# Patient Record
Sex: Female | Born: 1945 | Race: White | Hispanic: No | Smoking: Never smoker
Health system: Southern US, Community
[De-identification: ages and names within clinical notes are randomized; demographics above are authoritative.]

## PROBLEM LIST (undated history)

## (undated) DIAGNOSIS — K589 Irritable bowel syndrome without diarrhea: Secondary | ICD-10-CM

## (undated) DIAGNOSIS — I341 Nonrheumatic mitral (valve) prolapse: Secondary | ICD-10-CM

## (undated) DIAGNOSIS — R32 Unspecified urinary incontinence: Secondary | ICD-10-CM

## (undated) DIAGNOSIS — I10 Essential (primary) hypertension: Secondary | ICD-10-CM

## (undated) DIAGNOSIS — M8580 Other specified disorders of bone density and structure, unspecified site: Secondary | ICD-10-CM

## (undated) DIAGNOSIS — E785 Hyperlipidemia, unspecified: Secondary | ICD-10-CM

## (undated) DIAGNOSIS — I6521 Occlusion and stenosis of right carotid artery: Secondary | ICD-10-CM

## (undated) DIAGNOSIS — F419 Anxiety disorder, unspecified: Secondary | ICD-10-CM

## (undated) HISTORY — DX: Other specified disorders of bone density and structure, unspecified site: M85.80

## (undated) HISTORY — DX: Irritable bowel syndrome, unspecified: K58.9

## (undated) HISTORY — DX: Unspecified urinary incontinence: R32

## (undated) HISTORY — PX: ABDOMINAL HYSTERECTOMY: SHX81

## (undated) HISTORY — DX: Hyperlipidemia, unspecified: E78.5

## (undated) HISTORY — DX: Anxiety disorder, unspecified: F41.9

## (undated) HISTORY — DX: Essential (primary) hypertension: I10

## (undated) HISTORY — PX: OTHER SURGICAL HISTORY: SHX169

## (undated) HISTORY — DX: Nonrheumatic mitral (valve) prolapse: I34.1

## (undated) HISTORY — PX: TUBAL LIGATION: SHX77

## (undated) HISTORY — PX: APPENDECTOMY: SHX54

## (undated) HISTORY — DX: Occlusion and stenosis of right carotid artery: I65.21

---

## 1998-05-08 ENCOUNTER — Ambulatory Visit (HOSPITAL_COMMUNITY): Admission: RE | Admit: 1998-05-08 | Discharge: 1998-05-08 | Payer: Self-pay | Admitting: Gastroenterology

## 2001-12-29 ENCOUNTER — Ambulatory Visit (HOSPITAL_COMMUNITY): Admission: RE | Admit: 2001-12-29 | Discharge: 2001-12-29 | Payer: Self-pay | Admitting: Family Medicine

## 2003-10-21 ENCOUNTER — Ambulatory Visit (HOSPITAL_COMMUNITY): Admission: RE | Admit: 2003-10-21 | Discharge: 2003-10-21 | Payer: Self-pay | Admitting: Gastroenterology

## 2010-07-24 LAB — HM PAP SMEAR: HM Pap smear: NORMAL

## 2010-08-10 ENCOUNTER — Other Ambulatory Visit: Payer: Self-pay | Admitting: Gastroenterology

## 2010-12-10 ENCOUNTER — Encounter: Payer: Self-pay | Admitting: Family Medicine

## 2010-12-10 DIAGNOSIS — I341 Nonrheumatic mitral (valve) prolapse: Secondary | ICD-10-CM | POA: Insufficient documentation

## 2011-02-21 ENCOUNTER — Other Ambulatory Visit: Payer: Self-pay | Admitting: Family Medicine

## 2011-02-21 DIAGNOSIS — Z780 Asymptomatic menopausal state: Secondary | ICD-10-CM

## 2011-02-26 ENCOUNTER — Ambulatory Visit
Admission: RE | Admit: 2011-02-26 | Discharge: 2011-02-26 | Disposition: A | Discharge status: Home / Self Care | Payer: Medicare Other | Source: Ambulatory Visit | Attending: Family Medicine | Admitting: Family Medicine

## 2011-02-26 DIAGNOSIS — Z780 Asymptomatic menopausal state: Secondary | ICD-10-CM

## 2011-03-05 ENCOUNTER — Ambulatory Visit (HOSPITAL_COMMUNITY): Payer: Medicare Other

## 2011-03-14 ENCOUNTER — Ambulatory Visit (HOSPITAL_COMMUNITY)
Admission: RE | Admit: 2011-03-14 | Discharge: 2011-03-14 | Disposition: A | Discharge status: Home / Self Care | Payer: Medicare Other | Source: Ambulatory Visit | Attending: Family Medicine | Admitting: Family Medicine

## 2011-03-14 DIAGNOSIS — I059 Rheumatic mitral valve disease, unspecified: Secondary | ICD-10-CM | POA: Insufficient documentation

## 2011-03-14 DIAGNOSIS — I369 Nonrheumatic tricuspid valve disorder, unspecified: Secondary | ICD-10-CM

## 2011-07-31 DIAGNOSIS — K648 Other hemorrhoids: Secondary | ICD-10-CM | POA: Diagnosis not present

## 2011-07-31 DIAGNOSIS — K625 Hemorrhage of anus and rectum: Secondary | ICD-10-CM | POA: Diagnosis not present

## 2011-07-31 DIAGNOSIS — K589 Irritable bowel syndrome without diarrhea: Secondary | ICD-10-CM | POA: Diagnosis not present

## 2011-07-31 DIAGNOSIS — K5900 Constipation, unspecified: Secondary | ICD-10-CM | POA: Diagnosis not present

## 2011-08-14 DIAGNOSIS — K5900 Constipation, unspecified: Secondary | ICD-10-CM | POA: Diagnosis not present

## 2011-08-14 DIAGNOSIS — K648 Other hemorrhoids: Secondary | ICD-10-CM | POA: Diagnosis not present

## 2011-09-04 DIAGNOSIS — K648 Other hemorrhoids: Secondary | ICD-10-CM | POA: Diagnosis not present

## 2011-09-30 DIAGNOSIS — Z1231 Encounter for screening mammogram for malignant neoplasm of breast: Secondary | ICD-10-CM | POA: Diagnosis not present

## 2011-11-13 DIAGNOSIS — H2510 Age-related nuclear cataract, unspecified eye: Secondary | ICD-10-CM | POA: Diagnosis not present

## 2012-01-07 DIAGNOSIS — D485 Neoplasm of uncertain behavior of skin: Secondary | ICD-10-CM | POA: Diagnosis not present

## 2012-01-07 DIAGNOSIS — L259 Unspecified contact dermatitis, unspecified cause: Secondary | ICD-10-CM | POA: Diagnosis not present

## 2012-01-29 DIAGNOSIS — E559 Vitamin D deficiency, unspecified: Secondary | ICD-10-CM | POA: Diagnosis not present

## 2012-01-29 DIAGNOSIS — Z79899 Other long term (current) drug therapy: Secondary | ICD-10-CM | POA: Diagnosis not present

## 2012-01-29 DIAGNOSIS — I259 Chronic ischemic heart disease, unspecified: Secondary | ICD-10-CM | POA: Diagnosis not present

## 2012-01-29 DIAGNOSIS — Z0000 Encounter for general adult medical examination without abnormal findings: Secondary | ICD-10-CM | POA: Diagnosis not present

## 2012-01-29 DIAGNOSIS — E785 Hyperlipidemia, unspecified: Secondary | ICD-10-CM | POA: Diagnosis not present

## 2012-02-05 DIAGNOSIS — Z0000 Encounter for general adult medical examination without abnormal findings: Secondary | ICD-10-CM | POA: Diagnosis not present

## 2012-03-06 DIAGNOSIS — Z23 Encounter for immunization: Secondary | ICD-10-CM | POA: Diagnosis not present

## 2012-03-26 DIAGNOSIS — K589 Irritable bowel syndrome without diarrhea: Secondary | ICD-10-CM | POA: Diagnosis not present

## 2012-03-26 DIAGNOSIS — K219 Gastro-esophageal reflux disease without esophagitis: Secondary | ICD-10-CM | POA: Diagnosis not present

## 2012-07-29 DIAGNOSIS — R300 Dysuria: Secondary | ICD-10-CM | POA: Diagnosis not present

## 2012-07-29 DIAGNOSIS — Z0000 Encounter for general adult medical examination without abnormal findings: Secondary | ICD-10-CM | POA: Diagnosis not present

## 2012-07-29 DIAGNOSIS — N760 Acute vaginitis: Secondary | ICD-10-CM | POA: Diagnosis not present

## 2012-07-29 DIAGNOSIS — Z124 Encounter for screening for malignant neoplasm of cervix: Secondary | ICD-10-CM | POA: Diagnosis not present

## 2012-07-29 DIAGNOSIS — E785 Hyperlipidemia, unspecified: Secondary | ICD-10-CM | POA: Diagnosis not present

## 2012-07-29 DIAGNOSIS — Z20828 Contact with and (suspected) exposure to other viral communicable diseases: Secondary | ICD-10-CM | POA: Diagnosis not present

## 2012-07-29 DIAGNOSIS — R109 Unspecified abdominal pain: Secondary | ICD-10-CM | POA: Diagnosis not present

## 2012-07-29 DIAGNOSIS — Z01419 Encounter for gynecological examination (general) (routine) without abnormal findings: Secondary | ICD-10-CM | POA: Diagnosis not present

## 2012-10-02 DIAGNOSIS — Z1231 Encounter for screening mammogram for malignant neoplasm of breast: Secondary | ICD-10-CM | POA: Diagnosis not present

## 2012-10-17 ENCOUNTER — Encounter: Payer: Self-pay | Admitting: Family Medicine

## 2012-11-24 ENCOUNTER — Telehealth: Payer: Self-pay | Admitting: Family Medicine

## 2012-11-24 NOTE — Telephone Encounter (Signed)
Ok to refill 

## 2012-11-24 NOTE — Telephone Encounter (Signed)
?   OK to Refill  

## 2012-11-25 MED ORDER — ALPRAZOLAM 0.25 MG PO TABS: 0.2500 mg | ORAL_TABLET | Freq: Three times a day (TID) | ORAL | Status: DC | PRN

## 2012-12-23 ENCOUNTER — Other Ambulatory Visit: Payer: Self-pay | Admitting: Family Medicine

## 2012-12-27 ENCOUNTER — Other Ambulatory Visit: Payer: Self-pay | Admitting: Family Medicine

## 2013-01-04 ENCOUNTER — Other Ambulatory Visit: Payer: Self-pay | Admitting: Family Medicine

## 2013-01-04 NOTE — Telephone Encounter (Signed)
?   OK to Refill  

## 2013-01-05 NOTE — Telephone Encounter (Signed)
?   OK to Refill  

## 2013-01-05 NOTE — Telephone Encounter (Signed)
Ok to refill 

## 2013-01-05 NOTE — Telephone Encounter (Signed)
RX called in .

## 2013-01-16 DIAGNOSIS — R030 Elevated blood-pressure reading, without diagnosis of hypertension: Secondary | ICD-10-CM | POA: Diagnosis not present

## 2013-01-16 DIAGNOSIS — J019 Acute sinusitis, unspecified: Secondary | ICD-10-CM | POA: Diagnosis not present

## 2013-02-05 ENCOUNTER — Encounter: Payer: Self-pay | Admitting: Family Medicine

## 2013-02-09 ENCOUNTER — Other Ambulatory Visit: Payer: Self-pay | Admitting: Family Medicine

## 2013-02-10 NOTE — Telephone Encounter (Signed)
.?   OK to Refill / has CPE scheduled here 02/16/13

## 2013-02-11 NOTE — Telephone Encounter (Signed)
ok 

## 2013-02-16 ENCOUNTER — Encounter: Payer: Self-pay | Admitting: Family Medicine

## 2013-02-16 ENCOUNTER — Ambulatory Visit (INDEPENDENT_AMBULATORY_CARE_PROVIDER_SITE_OTHER): Payer: Medicare Other | Admitting: Family Medicine

## 2013-02-16 VITALS — BP 136/74 | HR 82 | Temp 98.4°F | Resp 14 | Ht 62.5 in | Wt 123.0 lb

## 2013-02-16 DIAGNOSIS — M8580 Other specified disorders of bone density and structure, unspecified site: Secondary | ICD-10-CM | POA: Insufficient documentation

## 2013-02-16 DIAGNOSIS — Z0000 Encounter for general adult medical examination without abnormal findings: Secondary | ICD-10-CM

## 2013-02-16 DIAGNOSIS — Z23 Encounter for immunization: Secondary | ICD-10-CM

## 2013-02-16 DIAGNOSIS — F419 Anxiety disorder, unspecified: Secondary | ICD-10-CM | POA: Insufficient documentation

## 2013-02-16 DIAGNOSIS — Z79899 Other long term (current) drug therapy: Secondary | ICD-10-CM | POA: Diagnosis not present

## 2013-02-16 LAB — COMPLETE METABOLIC PANEL WITH GFR
ALT: 10 U/L (ref 0–35)
AST: 14 U/L (ref 0–37)
Albumin: 4.2 g/dL (ref 3.5–5.2)
Calcium: 9.4 mg/dL (ref 8.4–10.5)
Chloride: 104 mEq/L (ref 96–112)
Potassium: 4.3 mEq/L (ref 3.5–5.3)
Sodium: 139 mEq/L (ref 135–145)
Total Protein: 6.9 g/dL (ref 6.0–8.3)

## 2013-02-16 LAB — CBC WITH DIFFERENTIAL/PLATELET
Basophils Relative: 0 % (ref 0–1)
Hemoglobin: 13.1 g/dL (ref 12.0–15.0)
Lymphs Abs: 2.2 10*3/uL (ref 0.7–4.0)
Monocytes Relative: 5 % (ref 3–12)
Neutro Abs: 6.4 10*3/uL (ref 1.7–7.7)
Neutrophils Relative %: 70 % (ref 43–77)
Platelets: 268 10*3/uL (ref 150–400)
RBC: 4.2 MIL/uL (ref 3.87–5.11)

## 2013-02-16 MED ORDER — ESTRADIOL 0.5 MG PO TABS: 0.5000 mg | ORAL_TABLET | Freq: Every day | ORAL | Status: DC

## 2013-02-16 MED ORDER — ALENDRONATE SODIUM 70 MG PO TABS: 70.0000 mg | ORAL_TABLET | ORAL | Status: DC

## 2013-02-16 MED ORDER — ESTRADIOL 0.1 MG/GM VA CREA: 2.0000 g | TOPICAL_CREAM | Freq: Every day | VAGINAL | Status: DC

## 2013-02-16 NOTE — Progress Notes (Signed)
Subjective:    Patient ID: Melissa Owen, female    DOB: 1945-11-03, 67 y.o.   MRN: [id number]  HPI Patient is a pleasant 67 year old white female here today for complete physical exam. Her last colonoscopy was in 2012 and was normal. She had the Tdap shot in 2010.  She has had Pneumovax and Zostavax in 2010.  She is due for a flu shot. Her mammogram is up-to-date. Her last Pap smear was in 2014.  Her cholesterol was checked in February 2014 and was normal.  Her bone density was in 2000 that showed osteopenia and a T score of -2.2 in the left femur. Past Medical History  Diagnosis Date  . Mitral valve prolapse   . Osteopenia   . Anxiety    Current Outpatient Prescriptions on File Prior to Visit  Medication Sig Dispense Refill  . ALPRAZolam (XANAX) 0.25 MG tablet TAKE 1 TABLET BY MOUTH 3 TIMES A DAY  90 tablet  0   No current facility-administered medications on file prior to visit.   Past Surgical History  Procedure Laterality Date  . Carpal tunnel repair    . Abdominal hysterectomy    . Tubal ligation     Allergies  Allergen Reactions  . Aspirin   . Latex    History   Social History  . Marital Status: Divorced    Spouse Name: N/A    Number of Children: N/A  . Years of Education: N/A   Occupational History  . Not on file.   Social History Main Topics  . Smoking status: Never Smoker   . Smokeless tobacco: Never Used  . Alcohol Use: Yes     Comment: Occasional  . Drug Use: No  . Sexual Activity: Not on file     Comment: Divorced, dating but not in a monogamous relationship.  2 sons.   Other Topics Concern  . Not on file   Social History Narrative  . No narrative on file   Family History  Problem Relation Age of Onset  . Dementia Mother   . Heart disease Mother 80  . Cancer Maternal Grandmother     colon cancer      Review of Systems  All other systems reviewed and are negative.       Objective:   Physical Exam  Vitals reviewed. Constitutional:  She is oriented to person, place, and time. She appears well-developed and well-nourished. No distress.  HENT:  Head: Normocephalic and atraumatic.  Right Ear: External ear normal.  Left Ear: External ear normal.  Nose: Nose normal.  Mouth/Throat: Oropharynx is clear and moist. No oropharyngeal exudate.  Eyes: Conjunctivae and EOM are normal. Pupils are equal, round, and reactive to light. Right eye exhibits no discharge. Left eye exhibits no discharge. No scleral icterus.  Neck: Normal range of motion. Neck supple. No JVD present. No tracheal deviation present. No thyromegaly present.  Cardiovascular: Normal rate, regular rhythm, normal heart sounds and intact distal pulses.  Exam reveals no gallop and no friction rub.   No murmur heard. Pulmonary/Chest: Effort normal and breath sounds normal. No stridor. No respiratory distress. She has no wheezes. She has no rales. She exhibits no tenderness.  Abdominal: Soft. Bowel sounds are normal. She exhibits no distension and no mass. There is no tenderness. There is no rebound and no guarding.  Musculoskeletal: Normal range of motion. She exhibits no edema and no tenderness.  Lymphadenopathy:    She has no cervical adenopathy.  Neurological: She is alert  and oriented to person, place, and time. She has normal reflexes. She displays normal reflexes. No cranial nerve deficit. She exhibits normal muscle tone. Coordination normal.  Skin: Skin is warm. No rash noted. She is not diaphoretic. No erythema. No pallor.  Psychiatric: She has a normal mood and affect. Her behavior is normal. Judgment and thought content normal.   breast exam was normal. She has no suspicious Moles.        Assessment & Plan:  1. Routine general medical examination at a health care facility Physical exam is normal. Cancer screening is up to date. I will give the patient flu shot today. Otherwise her immunizations are up to date. Her cholesterol was excellent in February and  does not need to be rechecked today. I will check a CBC, CMP, TSH. I recommended calcium from her milligrams a day and vitamin D 1000 units per day. Also start patient on Fosamax 70 mg by mouth every week. I would recheck a bone density test in 2 years.  Recommend Pneumovax again in 2015. - CBC with Differential - COMPLETE METABOLIC PANEL WITH GFR - TSH  2. Need for prophylactic vaccination and inoculation against influenza - Flu Vaccine QUAD 36+ mos IM

## 2013-03-11 ENCOUNTER — Other Ambulatory Visit: Payer: Self-pay | Admitting: Family Medicine

## 2013-03-11 NOTE — Telephone Encounter (Signed)
?   OK to Refill [facility] 02/09/13

## 2013-03-11 NOTE — Telephone Encounter (Signed)
ok 

## 2013-04-12 ENCOUNTER — Other Ambulatory Visit: Payer: Self-pay | Admitting: Family Medicine

## 2013-04-12 NOTE — Telephone Encounter (Signed)
ok 

## 2013-04-12 NOTE — Telephone Encounter (Signed)
?   OK to Refill  

## 2013-04-12 NOTE — Telephone Encounter (Signed)
rx called in

## 2013-04-19 DIAGNOSIS — K219 Gastro-esophageal reflux disease without esophagitis: Secondary | ICD-10-CM | POA: Diagnosis not present

## 2013-04-19 DIAGNOSIS — K589 Irritable bowel syndrome without diarrhea: Secondary | ICD-10-CM | POA: Diagnosis not present

## 2013-05-12 ENCOUNTER — Other Ambulatory Visit: Payer: Self-pay | Admitting: Family Medicine

## 2013-05-13 NOTE — Telephone Encounter (Signed)
?   OK to Refill  

## 2013-05-13 NOTE — Telephone Encounter (Signed)
ok 

## 2013-06-16 ENCOUNTER — Other Ambulatory Visit: Payer: Self-pay | Admitting: Family Medicine

## 2013-06-16 NOTE — Telephone Encounter (Signed)
?   OK to Refill  

## 2013-06-17 NOTE — Telephone Encounter (Signed)
Meds refilled.

## 2013-06-17 NOTE — Telephone Encounter (Signed)
ok 

## 2013-08-16 ENCOUNTER — Other Ambulatory Visit: Payer: Self-pay | Admitting: Family Medicine

## 2013-08-16 NOTE — Telephone Encounter (Signed)
?   OK to Refill  

## 2013-08-16 NOTE — Telephone Encounter (Signed)
ok 

## 2013-09-16 ENCOUNTER — Other Ambulatory Visit: Payer: Self-pay | Admitting: Family Medicine

## 2013-09-17 NOTE — Telephone Encounter (Signed)
Medication called to pharmacy. 

## 2013-09-17 NOTE — Telephone Encounter (Signed)
ok 

## 2013-09-17 NOTE — Telephone Encounter (Signed)
Ok to refill??  Last office visit 02/16/2013.  Last refill 08/16/2013.

## 2013-09-26 DIAGNOSIS — H6120 Impacted cerumen, unspecified ear: Secondary | ICD-10-CM | POA: Diagnosis not present

## 2013-09-26 DIAGNOSIS — J019 Acute sinusitis, unspecified: Secondary | ICD-10-CM | POA: Diagnosis not present

## 2013-10-20 ENCOUNTER — Other Ambulatory Visit: Payer: Self-pay | Admitting: Family Medicine

## 2013-10-20 NOTE — Telephone Encounter (Signed)
?   OK to Refill  

## 2013-10-21 NOTE — Telephone Encounter (Signed)
ok 

## 2013-10-23 DIAGNOSIS — Z1231 Encounter for screening mammogram for malignant neoplasm of breast: Secondary | ICD-10-CM | POA: Diagnosis not present

## 2013-11-15 ENCOUNTER — Encounter: Payer: Self-pay | Admitting: Family Medicine

## 2013-11-23 DIAGNOSIS — H2510 Age-related nuclear cataract, unspecified eye: Secondary | ICD-10-CM | POA: Diagnosis not present

## 2014-01-24 ENCOUNTER — Other Ambulatory Visit: Payer: Self-pay | Admitting: Family Medicine

## 2014-01-24 NOTE — Telephone Encounter (Signed)
Medication called to pharmacy. 

## 2014-01-24 NOTE — Telephone Encounter (Signed)
ok 

## 2014-01-24 NOTE — Telephone Encounter (Signed)
Ok to refill??  Last office visit 02/16/2013.  Last refill 10/21/2013, #2 refills.

## 2014-02-24 ENCOUNTER — Other Ambulatory Visit: Payer: Self-pay | Admitting: Family Medicine

## 2014-03-14 ENCOUNTER — Encounter: Payer: Self-pay | Admitting: Family Medicine

## 2014-03-14 ENCOUNTER — Ambulatory Visit (INDEPENDENT_AMBULATORY_CARE_PROVIDER_SITE_OTHER): Payer: Medicare Other | Admitting: Family Medicine

## 2014-03-14 VITALS — BP 144/78 | HR 84 | Temp 97.5°F | Resp 18 | Ht 61.0 in | Wt 122.0 lb

## 2014-03-14 DIAGNOSIS — Z23 Encounter for immunization: Secondary | ICD-10-CM

## 2014-03-14 DIAGNOSIS — Z0000 Encounter for general adult medical examination without abnormal findings: Secondary | ICD-10-CM | POA: Diagnosis not present

## 2014-03-14 LAB — CBC WITH DIFFERENTIAL/PLATELET
BASOS PCT: 0 % (ref 0–1)
Basophils Absolute: 0 10*3/uL (ref 0.0–0.1)
EOS PCT: 1 % (ref 0–5)
Eosinophils Absolute: 0.1 10*3/uL (ref 0.0–0.7)
HEMATOCRIT: 39.5 % (ref 36.0–46.0)
Hemoglobin: 13.1 g/dL (ref 12.0–15.0)
Lymphocytes Relative: 23 % (ref 12–46)
Lymphs Abs: 1.8 10*3/uL (ref 0.7–4.0)
MCH: 30.3 pg (ref 26.0–34.0)
MCHC: 33.2 g/dL (ref 30.0–36.0)
MCV: 91.4 fL (ref 78.0–100.0)
MONO ABS: 0.4 10*3/uL (ref 0.1–1.0)
Monocytes Relative: 5 % (ref 3–12)
NEUTROS ABS: 5.6 10*3/uL (ref 1.7–7.7)
Neutrophils Relative %: 71 % (ref 43–77)
Platelets: 308 10*3/uL (ref 150–400)
RBC: 4.32 MIL/uL (ref 3.87–5.11)
RDW: 13.1 % (ref 11.5–15.5)
WBC: 7.9 10*3/uL (ref 4.0–10.5)

## 2014-03-14 LAB — COMPLETE METABOLIC PANEL WITH GFR
ALK PHOS: 70 U/L (ref 39–117)
ALT: 11 U/L (ref 0–35)
AST: 14 U/L (ref 0–37)
Albumin: 4.4 g/dL (ref 3.5–5.2)
BUN: 11 mg/dL (ref 6–23)
CO2: 28 mEq/L (ref 19–32)
CREATININE: 0.72 mg/dL (ref 0.50–1.10)
Calcium: 9.5 mg/dL (ref 8.4–10.5)
Chloride: 102 mEq/L (ref 96–112)
GFR, EST NON AFRICAN AMERICAN: 86 mL/min
GFR, Est African American: >89 mL/min
Glucose, Bld: 101 mg/dL — ABNORMAL HIGH (ref 70–99)
Potassium: 4.3 mEq/L (ref 3.5–5.3)
Sodium: 139 mEq/L (ref 135–145)
Total Bilirubin: 0.5 mg/dL (ref 0.2–1.2)
Total Protein: 7.1 g/dL (ref 6.0–8.3)

## 2014-03-14 LAB — LIPID PANEL
CHOL/HDL RATIO: 2.3 ratio
CHOLESTEROL: 226 mg/dL — AB (ref 0–200)
HDL: 97 mg/dL (ref >39)
LDL Cholesterol: 106 mg/dL — ABNORMAL HIGH (ref 0–99)
TRIGLYCERIDES: 113 mg/dL (ref <150)
VLDL: 23 mg/dL (ref 0–40)

## 2014-03-14 LAB — TSH: TSH: 1.775 u[IU]/mL (ref 0.350–4.500)

## 2014-03-14 NOTE — Progress Notes (Signed)
Subjective:    Patient ID: Melissa Owen, female    DOB: 16-Dec-1945, 68 y.o.   MRN: [id number]  HPI Subjective:   Patient presents for Medicare Annual/Subsequent preventive examination.   Review Past Medical/Family/Social: Past Medical History  Diagnosis Date  . Mitral valve prolapse   . Osteopenia   . Anxiety    Past Surgical History  Procedure Laterality Date  . Carpal tunnel repair    . Abdominal hysterectomy    . Tubal ligation     Current Outpatient Prescriptions on File Prior to Visit  Medication Sig Dispense Refill  . ALPRAZolam (XANAX) 0.25 MG tablet TAKE 1 TABLET BY MOUTH 3 TIMES A DAY AS NEEDED  90 tablet  2  . estradiol (ESTRACE VAGINAL) 0.1 MG/GM vaginal cream Place 4.81 Applicatorfuls vaginally daily.  42.5 g  11  . estradiol (ESTRACE) 0.5 MG tablet TAKE 1 TABLET (0.5 MG TOTAL) BY MOUTH DAILY.  30 tablet  11  . pantoprazole (PROTONIX) 20 MG tablet Take 20 mg by mouth 2 (two) times daily.      . Wheat Dextrin (BENEFIBER PO) Take by mouth.      Marland Kitchen alendronate (FOSAMAX) 70 MG tablet Take 1 tablet (70 mg total) by mouth every 7 (seven) days. Take with a full glass of water on an empty stomach.  4 tablet  11   No current facility-administered medications on file prior to visit.   Allergies  Allergen Reactions  . Aspirin   . Latex    History   Social History  . Marital Status: Divorced    Spouse Name: N/A    Number of Children: N/A  . Years of Education: N/A   Occupational History  . Not on file.   Social History Main Topics  . Smoking status: Never Smoker   . Smokeless tobacco: Never Used  . Alcohol Use: Yes     Comment: Occasional  . Drug Use: No  . Sexual Activity: Not on file     Comment: Divorced, dating but not in a monogamous relationship.  2 sons.   Other Topics Concern  . Not on file   Social History Narrative  . No narrative on file   Family History  Problem Relation Age of Onset  . Dementia Mother   . Heart disease Mother 17  .  Cancer Maternal Grandmother     colon cancer    Depression Screen  (Note: if answer to either of the following is "Yes", a more complete depression screening is indicated)  Over the past two weeks, have you felt down, depressed or hopeless? No Over the past two weeks, have you felt little interest or pleasure in doing things? No Have you lost interest or pleasure in daily life? No Do you often feel hopeless? No Do you cry easily over simple problems? No   Activities of Daily Living  In your present state of health, do you have any difficulty performing the following activities?:  Driving? No  Managing money? No  Feeding yourself? No  Getting from bed to chair? No  Climbing a flight of stairs? No  Preparing food and eating?: No  Bathing or showering? No  Getting dressed: No  Getting to the toilet? No  Using the toilet:No  Moving around from place to place: No  In the past year have you fallen or had a near fall?:No  Are you sexually active? No  Do you have more than one partner? No   Hearing Difficulties: No  Do you often ask people to speak up or repeat themselves? No  Do you experience ringing or noises in your ears? No Do you have difficulty understanding soft or whispered voices? No  Do you feel that you have a problem with memory? No Do you often misplace items? No  Do you feel safe at home? Yes  Cognitive Testing  Alert? Yes Normal Appearance?Yes  Oriented to person? Yes Place? Yes  Time? Yes  Recall of three objects? Yes  Can perform simple calculations? Yes  Displays appropriate judgment?Yes  Can read the correct time from a watch face?Yes   Screening Tests / Date Colonoscopy    2013                 Zostavax 2010 Mammogram 5/15 Influenza Vaccine due Tetanus/tdap 2010  Review of Systems  All other systems reviewed and are negative.      Objective:   Physical Exam  Vitals reviewed. Constitutional: She is oriented to person, place, and time. She  appears well-developed and well-nourished. No distress.  HENT:  Head: Normocephalic and atraumatic.  Right Ear: External ear normal.  Left Ear: External ear normal.  Nose: Nose normal.  Mouth/Throat: Oropharynx is clear and moist. No oropharyngeal exudate.  Eyes: Conjunctivae and EOM are normal. Pupils are equal, round, and reactive to light. Right eye exhibits no discharge. Left eye exhibits no discharge. No scleral icterus.  Neck: Normal range of motion. Neck supple. No JVD present. No tracheal deviation present. No thyromegaly present.  Cardiovascular: Normal rate, regular rhythm, normal heart sounds and intact distal pulses.  Exam reveals no gallop and no friction rub.   No murmur heard. Pulmonary/Chest: Effort normal and breath sounds normal. No stridor. No respiratory distress. She has no wheezes. She has no rales. She exhibits no tenderness.  Abdominal: Soft. Bowel sounds are normal. She exhibits no distension and no mass. There is no tenderness. There is no rebound and no guarding.  Musculoskeletal: Normal range of motion. She exhibits no edema and no tenderness.  Lymphadenopathy:    She has no cervical adenopathy.  Neurological: She is alert and oriented to person, place, and time. She has normal reflexes. She displays normal reflexes. No cranial nerve deficit. She exhibits normal muscle tone. Coordination normal.  Skin: Skin is warm. No rash noted. She is not diaphoretic. No erythema. No pallor.  Psychiatric: She has a normal mood and affect. Her behavior is normal. Judgment and thought content normal.          Assessment & Plan:  Routine general medical examination at a health care facility - Plan: CBC with Differential, COMPLETE METABOLIC PANEL WITH GFR, Lipid panel, TSH  patient's physical exam is completely normal. Her mammogram and colonoscopy are up-to-date. She is going to schedule herself to see Dr. Phineas Real for her Pap smear and pelvic exam. Give the patient the flu shot  today as well as Prevnar 13. I will check a CBC, CMP, fasting lipid panel, TSH. Patient's bone density was significant for a T score of -2.2 in 2012. She's been unable to take bisphosphonates. She is already on estrogen replacement. Therefore I will inquire about prolia.  Medicare Attestation  I have personally reviewed:  The patient's medical and social history  Their use of alcohol, tobacco or illicit drugs  Their current medications and supplements  The patient's functional ability including ADLs,fall risks, home safety risks, cognitive, and hearing and visual impairment  Diet and physical activities  Evidence for depression or  mood disorders  The patient's weight, height, BMI, and visual acuity have been recorded in the chart. I have made referrals, counseling, and provided education to the patient based on review of the above and I have provided the patient with a written personalized care plan for preventive services.

## 2014-03-14 NOTE — Addendum Note (Signed)
Addended by: Shary Decamp B on: 03/14/2014 08:50 AM   Modules accepted: Orders

## 2014-04-15 ENCOUNTER — Telehealth: Payer: Self-pay | Admitting: Family Medicine

## 2014-04-15 MED ORDER — ESTRADIOL 0.1 MG/GM VA CREA: 2.0000 g | TOPICAL_CREAM | Freq: Every day | VAGINAL | Status: DC

## 2014-04-15 NOTE — Telephone Encounter (Signed)
Pt called.  Summary of benefits has gone through for Prolia.  Pt ready to start. Will order and pt can come to get next week.

## 2014-04-26 ENCOUNTER — Telehealth: Payer: Self-pay | Admitting: Family Medicine

## 2014-04-26 ENCOUNTER — Other Ambulatory Visit: Payer: Self-pay | Admitting: Family Medicine

## 2014-04-26 NOTE — Telephone Encounter (Signed)
rx called in

## 2014-04-26 NOTE — Telephone Encounter (Signed)
Okay to refill? 

## 2014-04-26 NOTE — Telephone Encounter (Signed)
Last RF 8/17 #90 + 2.  Last OV 03/14/14 (CPE)  OK refill?

## 2014-04-26 NOTE — Telephone Encounter (Signed)
Pt aware approval for Prolia has gone through.  Medication here and pt aware.  Is coming on Thursday

## 2014-04-28 ENCOUNTER — Ambulatory Visit (INDEPENDENT_AMBULATORY_CARE_PROVIDER_SITE_OTHER): Payer: Medicare Other | Admitting: Family Medicine

## 2014-04-28 DIAGNOSIS — M810 Age-related osteoporosis without current pathological fracture: Secondary | ICD-10-CM

## 2014-04-28 MED ORDER — DENOSUMAB 60 MG/ML ~~LOC~~ SOLN
60.0000 mg | Freq: Once | SUBCUTANEOUS | Status: AC
  Administered 2014-04-28: 60 mg via SUBCUTANEOUS

## 2014-06-01 ENCOUNTER — Ambulatory Visit (INDEPENDENT_AMBULATORY_CARE_PROVIDER_SITE_OTHER): Payer: Medicare Other | Admitting: Gynecology

## 2014-06-01 ENCOUNTER — Encounter: Payer: Self-pay | Admitting: Gynecology

## 2014-06-01 VITALS — BP 120/74 | Ht 63.0 in | Wt 120.0 lb

## 2014-06-01 DIAGNOSIS — R8290 Unspecified abnormal findings in urine: Secondary | ICD-10-CM | POA: Diagnosis not present

## 2014-06-01 DIAGNOSIS — Z79890 Hormone replacement therapy (postmenopausal): Secondary | ICD-10-CM | POA: Diagnosis not present

## 2014-06-01 DIAGNOSIS — N8111 Cystocele, midline: Secondary | ICD-10-CM

## 2014-06-01 DIAGNOSIS — B373 Candidiasis of vulva and vagina: Secondary | ICD-10-CM

## 2014-06-01 DIAGNOSIS — N952 Postmenopausal atrophic vaginitis: Secondary | ICD-10-CM

## 2014-06-01 DIAGNOSIS — B3731 Acute candidiasis of vulva and vagina: Secondary | ICD-10-CM

## 2014-06-01 LAB — WET PREP FOR TRICH, YEAST, CLUE
Clue Cells Wet Prep HPF POC: NONE SEEN
TRICH WET PREP: NONE SEEN

## 2014-06-01 MED ORDER — FLUCONAZOLE 150 MG PO TABS: 150.0000 mg | ORAL_TABLET | Freq: Once | ORAL | Status: DC

## 2014-06-01 NOTE — Progress Notes (Signed)
Melissa Owen 27-Nov-1945 [id number]        68 y.o.  G2P0012 new patient with several issues noted below. Former patient of Dr. Ree Edman.  Past medical history,surgical history, problem list, medications, allergies, family history and social history were all reviewed and documented as reviewed in the EPIC chart.  ROS:  Performed with pertinent positives and negatives included in the history, assessment and plan.   Additional significant findings :  none   Exam: Kim Counsellor Vitals:   06/01/14 1043  BP: 120/74  Height: 5\' 3"  (1.6 m)  Weight: 120 lb (54.432 kg)   General appearance:  Normal affect, orientation and appearance. Skin: Grossly normal HEENT: Without gross lesions.  No cervical or supraclavicular adenopathy. Thyroid normal.  Lungs:  Clear without wheezing, rales or rhonchi Cardiac: RR, without RMG Abdominal:  Soft, nontender, without masses, guarding, rebound, organomegaly or hernia Breasts:  Examined lying and sitting without masses, retractions, discharge or axillary adenopathy. Pelvic:  Ext/BUS/vagina with generalized atrophic changes. Slight white discharge. First-degree cystocele noted. Cuff well supported, no significant rectocele  Adnexa  Without masses or tenderness    Anus and perineum  Normal   Rectovaginal  Normal sphincter tone without palpated masses or tenderness.    Assessment/Plan:  68 y.o. [id number] female for follow up exam.   1. Vaginal irritation with discharge. Has slight white discharge and wet prep is positive for yeast.  Treatment with Diflucan 150 mg 1 dose. Follow up if symptoms persist, worsen or recur. 2. Postmenopausal/atrophic genital changes. Status post TAH for leiomyoma years ago. On estradiol 0.5 mg for years and Estrace vaginal cream 3 times weekly.  I reviewed the whole issue of HRT to include the WHI study and increased risk of stroke, heart attack, DVT and breast cancer. The ACOG and NAMS statements for lowest dose for shortness  period of time discussed. The issues of weaning as patients age.  We both agree that she will try to wean the estradiol and do this slowly taking it every other day for one month and then every third day for one month. Assuming she does well with this then she will stop taking it. She has a supply already that Dr. Dennard Schaumann just refilled. At this point she'll continue on the vaginal estrogen cream this year and again she has a supply that Dr. Dennard Schaumann refilled. She is sexually active. The issues of absorption and systemic effect also reviewed. 3. First-degree cystocele. Patient has been told that she has had a cystocele for years. She is asymptomatic without significant incontinence urgency or pressure symptoms. Will continue to monitor.  Check urinalysis. 4. Osteopenia. DEXA 2012 T score -2.2. Was recently started on Prolia by Dr. Dennard Schaumann. Is having some side effects such as muscle aching since the shot. I recommended she continue to follow up with him in reference to management for this. I did review with her though it probably would be good to repeat the bone density now to see where she stands. If her bone density is stable noting she has been on estrogen then they may want to hold on treatment and follow expectantly. No FRAX was done as she was on ERT.  She will follow up discussed with him. 5. Pap smear 2014. No Pap smear done today. No history of significant abnormal Pap smears. I reviewed current screening guidelines. Options to stop screening as she is over the age of 11 and status post hysterectomy for benign indications discussed. Will readdress on an annual basis. 6.  Mammography 10/2013. Continue with annual mammography. SBE monthly reviewed. 7. Colonoscopy 2012. Repeat at their recommended interval. 8. Health maintenance. No routine blood work done as she reports this done at her primary physician's office. Follow up 1 year, sooner as needed.     Anastasio Auerbach MD, 11:26 AM  06/01/2014

## 2014-06-01 NOTE — Patient Instructions (Signed)
Take the one Diflucan pill for the vaginal irritation. Call if your symptoms persist. Continue on the vaginal estrogen twice to 3 times weekly Wean off of the oral estrogen pills as we discussed. Call me if you have any issues with this.  You may obtain a copy of any labs that were done today by logging onto MyChart as outlined in the instructions provided with your AVS (after visit summary). The office will not call with normal lab results but certainly if there are any significant abnormalities then we will contact you.   Health Maintenance, Female A healthy lifestyle and preventative care can promote health and wellness.  Maintain regular health, dental, and eye exams.  Eat a healthy diet. Foods like vegetables, fruits, whole grains, low-fat dairy products, and lean protein foods contain the nutrients you need without too many calories. Decrease your intake of foods high in solid fats, added sugars, and salt. Get information about a proper diet from your caregiver, if necessary.  Regular physical exercise is one of the most important things you can do for your health. Most adults should get at least 150 minutes of moderate-intensity exercise (any activity that increases your heart rate and causes you to sweat) each week. In addition, most adults need muscle-strengthening exercises on 2 or more days a week.   Maintain a healthy weight. The body mass index (BMI) is a screening tool to identify possible weight problems. It provides an estimate of body fat based on height and weight. Your caregiver can help determine your BMI, and can help you achieve or maintain a healthy weight. For adults 20 years and older:  A BMI below 18.5 is considered underweight.  A BMI of 18.5 to 24.9 is normal.  A BMI of 25 to 29.9 is considered overweight.  A BMI of 30 and above is considered obese.  Maintain normal blood lipids and cholesterol by exercising and minimizing your intake of saturated fat. Eat a  balanced diet with plenty of fruits and vegetables. Blood tests for lipids and cholesterol should begin at age 28 and be repeated every 5 years. If your lipid or cholesterol levels are high, you are over 50, or you are a high risk for heart disease, you may need your cholesterol levels checked more frequently.Ongoing high lipid and cholesterol levels should be treated with medicines if diet and exercise are not effective.  If you smoke, find out from your caregiver how to quit. If you do not use tobacco, do not start.  Lung cancer screening is recommended for adults aged 58 80 years who are at high risk for developing lung cancer because of a history of smoking. Yearly low-dose computed tomography (CT) is recommended for people who have at least a 30-pack-year history of smoking and are a current smoker or have quit within the past 15 years. A pack year of smoking is smoking an average of 1 pack of cigarettes a day for 1 year (for example: 1 pack a day for 30 years or 2 packs a day for 15 years). Yearly screening should continue until the smoker has stopped smoking for at least 15 years. Yearly screening should also be stopped for people who develop a health problem that would prevent them from having lung cancer treatment.  If you are pregnant, do not drink alcohol. If you are breastfeeding, be very cautious about drinking alcohol. If you are not pregnant and choose to drink alcohol, do not exceed 1 drink per day. One drink is considered  to be 12 ounces (355 mL) of beer, 5 ounces (148 mL) of wine, or 1.5 ounces (44 mL) of liquor.  Avoid use of street drugs. Do not share needles with anyone. Ask for help if you need support or instructions about stopping the use of drugs.  High blood pressure causes heart disease and increases the risk of stroke. Blood pressure should be checked at least every 1 to 2 years. Ongoing high blood pressure should be treated with medicines, if weight loss and exercise are not  effective.  If you are 62 to 69 years old, ask your caregiver if you should take aspirin to prevent strokes.  Diabetes screening involves taking a blood sample to check your fasting blood sugar level. This should be done once every 3 years, after age 75, if you are within normal weight and without risk factors for diabetes. Testing should be considered at a younger age or be carried out more frequently if you are overweight and have at least 1 risk factor for diabetes.  Breast cancer screening is essential preventative care for women. You should practice "breast self-awareness." This means understanding the normal appearance and feel of your breasts and may include breast self-examination. Any changes detected, no matter how small, should be reported to a caregiver. Women in their 91s and 30s should have a clinical breast exam (CBE) by a caregiver as part of a regular health exam every 1 to 3 years. After age 52, women should have a CBE every year. Starting at age 65, women should consider having a mammogram (breast X-ray) every year. Women who have a family history of breast cancer should talk to their caregiver about genetic screening. Women at a high risk of breast cancer should talk to their caregiver about having an MRI and a mammogram every year.  Breast cancer gene (BRCA)-related cancer risk assessment is recommended for women who have family members with BRCA-related cancers. BRCA-related cancers include breast, ovarian, tubal, and peritoneal cancers. Having family members with these cancers may be associated with an increased risk for harmful changes (mutations) in the breast cancer genes BRCA1 and BRCA2. Results of the assessment will determine the need for genetic counseling and BRCA1 and BRCA2 testing.  The Pap test is a screening test for cervical cancer. Women should have a Pap test starting at age 35. Between ages 62 and 97, Pap tests should be repeated every 2 years. Beginning at age 34,  you should have a Pap test every 3 years as long as the past 3 Pap tests have been normal. If you had a hysterectomy for a problem that was not cancer or a condition that could lead to cancer, then you no longer need Pap tests. If you are between ages 3 and 44, and you have had normal Pap tests going back 10 years, you no longer need Pap tests. If you have had past treatment for cervical cancer or a condition that could lead to cancer, you need Pap tests and screening for cancer for at least 20 years after your treatment. If Pap tests have been discontinued, risk factors (such as a new sexual partner) need to be reassessed to determine if screening should be resumed. Some women have medical problems that increase the chance of getting cervical cancer. In these cases, your caregiver may recommend more frequent screening and Pap tests.  The human papillomavirus (HPV) test is an additional test that may be used for cervical cancer screening. The HPV test looks for the virus  that can cause the cell changes on the cervix. The cells collected during the Pap test can be tested for HPV. The HPV test could be used to screen women aged 19 years and older, and should be used in women of any age who have unclear Pap test results. After the age of 78, women should have HPV testing at the same frequency as a Pap test.  Colorectal cancer can be detected and often prevented. Most routine colorectal cancer screening begins at the age of 58 and continues through age 42. However, your caregiver may recommend screening at an earlier age if you have risk factors for colon cancer. On a yearly basis, your caregiver may provide home test kits to check for hidden blood in the stool. Use of a small camera at the end of a tube, to directly examine the colon (sigmoidoscopy or colonoscopy), can detect the earliest forms of colorectal cancer. Talk to your caregiver about this at age 85, when routine screening begins. Direct examination of  the colon should be repeated every 5 to 10 years through age 42, unless early forms of pre-cancerous polyps or small growths are found.  Hepatitis C blood testing is recommended for all people born from [birth year] through 1965 and any individual with known risks for hepatitis C.  Practice safe sex. Use condoms and avoid high-risk sexual practices to reduce the spread of sexually transmitted infections (STIs). Sexually active women aged 50 and younger should be checked for Chlamydia, which is a common sexually transmitted infection. Older women with new or multiple partners should also be tested for Chlamydia. Testing for other STIs is recommended if you are sexually active and at increased risk.  Osteoporosis is a disease in which the bones lose minerals and strength with aging. This can result in serious bone fractures. The risk of osteoporosis can be identified using a bone density scan. Women ages 27 and over and women at risk for fractures or osteoporosis should discuss screening with their caregivers. Ask your caregiver whether you should be taking a calcium supplement or vitamin D to reduce the rate of osteoporosis.  Menopause can be associated with physical symptoms and risks. Hormone replacement therapy is available to decrease symptoms and risks. You should talk to your caregiver about whether hormone replacement therapy is right for you.  Use sunscreen. Apply sunscreen liberally and repeatedly throughout the day. You should seek shade when your shadow is shorter than you. Protect yourself by wearing long sleeves, pants, a wide-brimmed hat, and sunglasses year round, whenever you are outdoors.  Notify your caregiver of new moles or changes in moles, especially if there is a change in shape or color. Also notify your caregiver if a mole is larger than the size of a pencil eraser.  Stay current with your immunizations. Document Released: 12/10/2010 Document Revised: 09/21/2012 Document Reviewed:  12/10/2010 [facility] Patient Information 2014 [place].

## 2014-06-01 NOTE — Addendum Note (Signed)
Addended by: Nelva Nay on: 06/01/2014 11:35 AM   Modules accepted: Orders

## 2014-06-03 LAB — URINALYSIS W MICROSCOPIC + REFLEX CULTURE
BILIRUBIN URINE: NEGATIVE
CASTS: NONE SEEN
Crystals: NONE SEEN
Glucose, UA: NEGATIVE mg/dL
Hgb urine dipstick: NEGATIVE
KETONES UR: NEGATIVE mg/dL
Leukocytes, UA: NEGATIVE
Nitrite: NEGATIVE
PH: 5.5 (ref 5.0–8.0)
Protein, ur: NEGATIVE mg/dL
SPECIFIC GRAVITY, URINE: 1.01 (ref 1.005–1.030)
Urobilinogen, UA: 0.2 mg/dL (ref 0.0–1.0)

## 2014-06-08 ENCOUNTER — Other Ambulatory Visit: Payer: Self-pay | Admitting: Gynecology

## 2014-06-08 MED ORDER — SULFAMETHOXAZOLE-TRIMETHOPRIM 800-160 MG PO TABS: 1.0000 | ORAL_TABLET | Freq: Two times a day (BID) | ORAL | Status: DC

## 2014-06-09 LAB — URINE CULTURE: Colony Count: >=100000

## 2014-08-05 ENCOUNTER — Other Ambulatory Visit: Payer: Self-pay | Admitting: Family Medicine

## 2014-08-05 NOTE — Telephone Encounter (Signed)
?   OK to Refill  

## 2014-08-05 NOTE — Telephone Encounter (Signed)
ok 

## 2014-08-08 NOTE — Telephone Encounter (Signed)
rx called in

## 2014-10-31 DIAGNOSIS — K580 Irritable bowel syndrome with diarrhea: Secondary | ICD-10-CM | POA: Diagnosis not present

## 2014-10-31 DIAGNOSIS — K219 Gastro-esophageal reflux disease without esophagitis: Secondary | ICD-10-CM | POA: Diagnosis not present

## 2014-11-02 ENCOUNTER — Ambulatory Visit (INDEPENDENT_AMBULATORY_CARE_PROVIDER_SITE_OTHER): Payer: Medicare Other | Admitting: Family Medicine

## 2014-11-02 DIAGNOSIS — M810 Age-related osteoporosis without current pathological fracture: Secondary | ICD-10-CM | POA: Diagnosis not present

## 2014-11-02 MED ORDER — DENOSUMAB 60 MG/ML ~~LOC~~ SOLN
60.0000 mg | Freq: Once | SUBCUTANEOUS | Status: AC
  Administered 2014-11-02: 60 mg via SUBCUTANEOUS

## 2014-11-10 DIAGNOSIS — Z1231 Encounter for screening mammogram for malignant neoplasm of breast: Secondary | ICD-10-CM | POA: Diagnosis not present

## 2014-11-11 ENCOUNTER — Encounter: Payer: Self-pay | Admitting: Gynecology

## 2014-11-14 ENCOUNTER — Other Ambulatory Visit: Payer: Self-pay | Admitting: Family Medicine

## 2014-11-14 NOTE — Telephone Encounter (Signed)
Ok to refill??  Last office visit 03/14/2014.  Last refill 08/08/2014, #2 refills.

## 2014-11-14 NOTE — Telephone Encounter (Signed)
ok 

## 2014-12-14 DIAGNOSIS — H2513 Age-related nuclear cataract, bilateral: Secondary | ICD-10-CM | POA: Diagnosis not present

## 2015-02-16 ENCOUNTER — Other Ambulatory Visit: Payer: Self-pay | Admitting: Family Medicine

## 2015-02-16 NOTE — Telephone Encounter (Signed)
Refill appropriate and filled per protocol. 

## 2015-02-16 NOTE — Telephone Encounter (Signed)
ok 

## 2015-02-16 NOTE — Telephone Encounter (Signed)
Ok to refill??  Last office visit 03/14/2014.  Last refill 11/14/2014. #2 refills.

## 2015-03-15 DIAGNOSIS — L820 Inflamed seborrheic keratosis: Secondary | ICD-10-CM | POA: Diagnosis not present

## 2015-03-15 DIAGNOSIS — D225 Melanocytic nevi of trunk: Secondary | ICD-10-CM | POA: Diagnosis not present

## 2015-03-15 DIAGNOSIS — L821 Other seborrheic keratosis: Secondary | ICD-10-CM | POA: Diagnosis not present

## 2015-03-15 DIAGNOSIS — L814 Other melanin hyperpigmentation: Secondary | ICD-10-CM | POA: Diagnosis not present

## 2015-03-15 DIAGNOSIS — D1801 Hemangioma of skin and subcutaneous tissue: Secondary | ICD-10-CM | POA: Diagnosis not present

## 2015-03-20 ENCOUNTER — Other Ambulatory Visit: Payer: Self-pay | Admitting: Family Medicine

## 2015-03-21 ENCOUNTER — Other Ambulatory Visit: Payer: Self-pay | Admitting: Family Medicine

## 2015-03-21 NOTE — Telephone Encounter (Signed)
Medication refilled per protocol. 

## 2015-04-06 ENCOUNTER — Other Ambulatory Visit: Payer: Medicare Other

## 2015-04-06 DIAGNOSIS — E559 Vitamin D deficiency, unspecified: Secondary | ICD-10-CM | POA: Diagnosis not present

## 2015-04-06 DIAGNOSIS — Z0000 Encounter for general adult medical examination without abnormal findings: Secondary | ICD-10-CM | POA: Diagnosis not present

## 2015-04-06 DIAGNOSIS — F419 Anxiety disorder, unspecified: Secondary | ICD-10-CM | POA: Diagnosis not present

## 2015-04-06 DIAGNOSIS — Z79899 Other long term (current) drug therapy: Secondary | ICD-10-CM | POA: Diagnosis not present

## 2015-04-06 DIAGNOSIS — M8580 Other specified disorders of bone density and structure, unspecified site: Secondary | ICD-10-CM

## 2015-04-06 DIAGNOSIS — Z79890 Hormone replacement therapy (postmenopausal): Secondary | ICD-10-CM | POA: Diagnosis not present

## 2015-04-06 LAB — COMPLETE METABOLIC PANEL WITH GFR
ALT: 14 U/L (ref 6–29)
AST: 16 U/L (ref 10–35)
Albumin: 4.3 g/dL (ref 3.6–5.1)
Alkaline Phosphatase: 54 U/L (ref 33–130)
BILIRUBIN TOTAL: 0.5 mg/dL (ref 0.2–1.2)
BUN: 11 mg/dL (ref 7–25)
CO2: 26 mmol/L (ref 20–31)
CREATININE: 0.68 mg/dL (ref 0.60–0.93)
Calcium: 9.2 mg/dL (ref 8.6–10.4)
Chloride: 102 mmol/L (ref 98–110)
GFR, Est Non African American: 89 mL/min (ref >=60)
Glucose, Bld: 86 mg/dL (ref 70–99)
Potassium: 4.1 mmol/L (ref 3.5–5.3)
Sodium: 140 mmol/L (ref 135–146)
TOTAL PROTEIN: 7.2 g/dL (ref 6.1–8.1)

## 2015-04-06 LAB — CBC WITH DIFFERENTIAL/PLATELET
BASOS ABS: 0 10*3/uL (ref 0.0–0.1)
Basophils Relative: 0 % (ref 0–1)
EOS PCT: 2 % (ref 0–5)
Eosinophils Absolute: 0.1 10*3/uL (ref 0.0–0.7)
HCT: 41.4 % (ref 36.0–46.0)
Hemoglobin: 13.8 g/dL (ref 12.0–15.0)
LYMPHS ABS: 2.1 10*3/uL (ref 0.7–4.0)
Lymphocytes Relative: 36 % (ref 12–46)
MCH: 31.1 pg (ref 26.0–34.0)
MCHC: 33.3 g/dL (ref 30.0–36.0)
MCV: 93.2 fL (ref 78.0–100.0)
MPV: 10.2 fL (ref 8.6–12.4)
Monocytes Absolute: 0.4 10*3/uL (ref 0.1–1.0)
Monocytes Relative: 7 % (ref 3–12)
NEUTROS ABS: 3.1 10*3/uL (ref 1.7–7.7)
NEUTROS PCT: 55 % (ref 43–77)
Platelets: 274 10*3/uL (ref 150–400)
RBC: 4.44 MIL/uL (ref 3.87–5.11)
RDW: 13.1 % (ref 11.5–15.5)
WBC: 5.7 10*3/uL (ref 4.0–10.5)

## 2015-04-06 LAB — LIPID PANEL
Cholesterol: 257 mg/dL — ABNORMAL HIGH (ref 125–200)
HDL: 108 mg/dL (ref >=46)
LDL Cholesterol: 125 mg/dL (ref <130)
TRIGLYCERIDES: 122 mg/dL (ref <150)
Total CHOL/HDL Ratio: 2.4 Ratio (ref <=5.0)
VLDL: 24 mg/dL (ref <30)

## 2015-04-06 LAB — TSH: TSH: 2.988 u[IU]/mL (ref 0.350–4.500)

## 2015-04-07 LAB — VITAMIN D 25 HYDROXY (VIT D DEFICIENCY, FRACTURES): Vit D, 25-Hydroxy: 17 ng/mL — ABNORMAL LOW (ref 30–100)

## 2015-04-10 ENCOUNTER — Encounter: Payer: Medicare Other | Admitting: Family Medicine

## 2015-04-17 ENCOUNTER — Encounter: Payer: Self-pay | Admitting: Family Medicine

## 2015-04-17 ENCOUNTER — Ambulatory Visit (INDEPENDENT_AMBULATORY_CARE_PROVIDER_SITE_OTHER): Payer: Medicare Other | Admitting: Family Medicine

## 2015-04-17 VITALS — BP 132/80 | HR 78 | Temp 98.5°F | Resp 18 | Wt 123.5 lb

## 2015-04-17 DIAGNOSIS — H6502 Acute serous otitis media, left ear: Secondary | ICD-10-CM | POA: Diagnosis not present

## 2015-04-17 DIAGNOSIS — J029 Acute pharyngitis, unspecified: Secondary | ICD-10-CM

## 2015-04-17 MED ORDER — CEFDINIR 300 MG PO CAPS: 300.0000 mg | ORAL_CAPSULE | Freq: Two times a day (BID) | ORAL | Status: DC

## 2015-04-17 NOTE — Progress Notes (Signed)
Subjective:    Patient ID: Melissa Owen, female    DOB: 1946-03-28, 69 y.o.   MRN: [id number]  HPI  symptoms began with a sore throat 1 week ago. However progressed to bilateral sinus pain and left maxillary sinus pain which is the worst. She also reports being unable to hear in her left ear. On examination, the left tympanic membrane is erythematous dull. It is bulging with a  Middle ear effusion. She also reports persistent cough productive of white mucus. Past Medical History  Diagnosis Date  . Mitral valve prolapse   . Osteopenia 2012    T score - 2.2  . Anxiety    Past Surgical History  Procedure Laterality Date  . Carpal tunnel repair    . Tubal ligation    . Abdominal hysterectomy  age 12    Leiomyomata   Current Outpatient Prescriptions on File Prior to Visit  Medication Sig Dispense Refill  . ALPRAZolam (XANAX) 0.25 MG tablet TAKE 1 TABLET 3 TIMES A DAY AS NEEDED 90 tablet 2  . denosumab (PROLIA) 60 MG/ML SOLN injection Inject 60 mg into the skin every 6 (six) months. Administer in upper arm, thigh, or abdomen    . dicyclomine (BENTYL) 20 MG tablet Take 20 mg by mouth 3 (three) times daily as needed for spasms.    Marland Kitchen estradiol (ESTRACE VAGINAL) 0.1 MG/GM vaginal cream Place 3.90 Applicatorfuls vaginally daily. 42.5 g 11  . estradiol (ESTRACE) 0.5 MG tablet TAKE 1 TABLET (0.5 MG TOTAL) BY MOUTH DAILY. 30 tablet 2  . famotidine (PEPCID) 20 MG tablet Take 20 mg by mouth 2 (two) times daily.    Marland Kitchen sulfamethoxazole-trimethoprim (BACTRIM DS,SEPTRA DS) 800-160 MG per tablet Take 1 tablet by mouth 2 (two) times daily. 6 tablet 0  . Wheat Dextrin (BENEFIBER PO) Take by mouth.     No current facility-administered medications on file prior to visit.   Allergies  Allergen Reactions  . Aspirin   . Latex    Social History   Social History  . Marital Status: Divorced    Spouse Name: N/A  . Number of Children: N/A  . Years of Education: N/A   Occupational History  . Not on  file.   Social History Main Topics  . Smoking status: Never Smoker   . Smokeless tobacco: Never Used  . Alcohol Use: 0.0 oz/week    0 Standard drinks or equivalent per week     Comment: Occasional  . Drug Use: No  . Sexual Activity: Yes     Comment: Divorced, dating but not in a monogamous relationship.  2 sons.   Other Topics Concern  . Not on file   Social History Narrative      Review of Systems  All other systems reviewed and are negative.      Objective:   Physical Exam  HENT:  Right Ear: External ear normal.  Left Ear: External ear normal. Tympanic membrane is erythematous and bulging. A middle ear effusion is present.  Nose: Right sinus exhibits no maxillary sinus tenderness and no frontal sinus tenderness. Left sinus exhibits maxillary sinus tenderness. Left sinus exhibits no frontal sinus tenderness.  Mouth/Throat: Oropharynx is clear and moist. No oropharyngeal exudate.  Eyes: Conjunctivae and EOM are normal. Right eye exhibits no discharge. Left eye exhibits no discharge.  Neck: Neck supple.  Cardiovascular: Normal rate, regular rhythm and normal heart sounds.   Pulmonary/Chest: Effort normal and breath sounds normal. No respiratory distress. She has no wheezes.  She has no rales.  Lymphadenopathy:    She has no cervical adenopathy.  Vitals reviewed.         Assessment & Plan:  Sore throat - Plan: Rapid Strep Screen  Acute serous otitis media of left ear, recurrence not specified - Plan: cefdinir (OMNICEF) 300 MG capsule   Patient had a viral upper respiratory infection which I believe his turn into a sinus infection. I believe her cough is due to postnasal drip. I believe she's developed secondary bacterial otitis media with effusion secondary to eustachian tube dysfunction. Begin Omnicef 300 mg by mouth twice a day for 10 days and recheck in one week.

## 2015-04-20 ENCOUNTER — Other Ambulatory Visit: Payer: Self-pay | Admitting: Family Medicine

## 2015-04-24 ENCOUNTER — Telehealth: Payer: Self-pay | Admitting: Family Medicine

## 2015-04-24 NOTE — Telephone Encounter (Signed)
Patient calling to talk with you regarding her last diagnosis and not getting any better  [phone]

## 2015-04-24 NOTE — Telephone Encounter (Signed)
Pt states that she still had a dry cough and her ears are still stopped up with some dizziness. ? What to do. Made pt appt

## 2015-04-25 DIAGNOSIS — K582 Mixed irritable bowel syndrome: Secondary | ICD-10-CM | POA: Diagnosis not present

## 2015-04-25 DIAGNOSIS — K5900 Constipation, unspecified: Secondary | ICD-10-CM | POA: Diagnosis not present

## 2015-04-25 DIAGNOSIS — Z1211 Encounter for screening for malignant neoplasm of colon: Secondary | ICD-10-CM | POA: Diagnosis not present

## 2015-04-26 ENCOUNTER — Encounter: Payer: Self-pay | Admitting: Physician Assistant

## 2015-04-26 ENCOUNTER — Ambulatory Visit (INDEPENDENT_AMBULATORY_CARE_PROVIDER_SITE_OTHER): Payer: Medicare Other | Admitting: Physician Assistant

## 2015-04-26 VITALS — BP 144/80 | HR 80 | Temp 98.5°F | Resp 18 | Wt 120.0 lb

## 2015-04-26 DIAGNOSIS — H9202 Otalgia, left ear: Secondary | ICD-10-CM

## 2015-04-26 NOTE — Progress Notes (Signed)
Patient ID: Melissa Owen MRN: [id number], DOB: 14-Apr-1946, 69 y.o. Date of Encounter: 04/26/2015, 4:34 PM    Chief Complaint:  Chief Complaint  Patient presents with  . still sick from last visit    left ear very bothersome     HPI: 69 y.o. year old white female presents for above.  I reviewed her recent office visit note with Dr. Dennard Schaumann from 04/17/15. At that time she reported that her symptoms began with a sore throat one week prior. Progressed to bilateral sinus pain and left maxillary sinus pain which was the worst. She also reported being unable to hear from the left ear. On exam the left tympanic membrane was erythematous and dull. It was bulging with a middle ear effusion. She also reported persistent cough productive of white mucus. His assessment/ plan was: Acute serous otitis media of the left ear. Treated with Omnicef. He felt that she had a viral upper respiratory infection which turned into a sinus infection. Felt that cough was secondary to postnasal drip. She has developed a secondary bacterial otitis media with effusion secondary to eustachian tube dysfunction. Prescribed Omnicef 300 mg twice a day for 10 days and recheck in one week.  Today she states that she took the [place] as directed. Says that now the throat just feels a little scratchy but no longer sore throat. However says that the left ear does not feel better. Has had no fever no chills.     Home Meds:   Outpatient Prescriptions Prior to Visit  Medication Sig Dispense Refill  . ALPRAZolam (XANAX) 0.25 MG tablet TAKE 1 TABLET 3 TIMES A DAY AS NEEDED 90 tablet 2  . denosumab (PROLIA) 60 MG/ML SOLN injection Inject 60 mg into the skin every 6 (six) months. Administer in upper arm, thigh, or abdomen    . dicyclomine (BENTYL) 20 MG tablet Take 20 mg by mouth 3 (three) times daily as needed for spasms.    Marland Kitchen estradiol (ESTRACE VAGINAL) 0.1 MG/GM vaginal cream Place [id number] Applicatorfuls vaginally daily. 42.5 g  11  . estradiol (ESTRACE) 0.5 MG tablet TAKE 1 TABLET (0.5 MG TOTAL) BY MOUTH DAILY. 30 tablet 2  . famotidine (PEPCID) 20 MG tablet Take 20 mg by mouth 2 (two) times daily.    . Wheat Dextrin (BENEFIBER PO) Take by mouth.    . cefdinir (OMNICEF) 300 MG capsule Take 1 capsule (300 mg total) by mouth 2 (two) times daily. (Patient not taking: Reported on 04/26/2015) 20 capsule 0  . Linaclotide (LINZESS) 145 MCG CAPS capsule Take 145 mcg by mouth daily.    Marland Kitchen estradiol (ESTRACE) 0.5 MG tablet TAKE 1 TABLET (0.5 MG TOTAL) BY MOUTH DAILY. 30 tablet 11  . sulfamethoxazole-trimethoprim (BACTRIM DS,SEPTRA DS) 800-160 MG per tablet Take 1 tablet by mouth 2 (two) times daily. 6 tablet 0   No facility-administered medications prior to visit.    Allergies:  Allergies  Allergen Reactions  . Aspirin   . Latex       Review of Systems: See HPI for pertinent ROS. All other ROS negative.    Physical Exam: Blood pressure 144/80, pulse 80, temperature 98.5 F (36.9 C), temperature source Oral, resp. rate 18, weight 120 lb (54.432 kg)., Body mass index is 21.26 kg/(m^2). General:  WNWD WF. Appears in no acute distress. HEENT: Normocephalic, atraumatic, eyes without discharge, sclera non-icteric, nares are without discharge.Left Ear canal normal. Left TM normal. Right Ear: There is golden-brown cerumen pushed up against the TM, covering  the bottom 3/4 of the TM. Remainder of canal patent and normal.   Left Ear Irrigated.  Repeat Exam after Irrigation: Cerumen no longer present. There is area in center of TM that appears inflamed secondary to irrigation.  Exam difficult secondary to irrigation but does not appear dull, does not appear c/w effusion.   Oral cavity moist, posterior pharynx without exudate, erythema, peritonsillar abscess, or post nasal drip. Percussion of frontal and maxillary sinuses reveals no tenderness with percussion.  Neck: Supple. No thyromegaly. No lymphadenopathy. Lungs: Clear  bilaterally to auscultation without wheezes, rales, or rhonchi. Breathing is unlabored. Heart: Regular rhythm. No murmurs, rubs, or gallops. Msk:  Strength and tone normal for age. Extremities/Skin: Warm and dry.  Neuro: Alert and oriented X 3. Moves all extremities spontaneously. Gait is normal. CNII-XII grossly in tact. Psych:  Responds to questions appropriately with a normal affect.     ASSESSMENT AND PLAN:  69 y.o. year old female with  1. Otalgia of left ear I really feel that the symptoms that she was still having today was secondary to the cerumen pressing up against the tympanic membrane. However at this time exam is compromised secondary to the irrigation that we had to do. I told her to go home sleep tonight and see how it's feeling tomorrow. If she is still having pain in the left ear or feeling that she is having decreased hearing in the left ear or having significant sinus pain tomorrow then call and I will treat with Augmentin.   [address] [place], [place], [facility] 04/26/2015 4:34 PM

## 2015-05-02 ENCOUNTER — Ambulatory Visit (INDEPENDENT_AMBULATORY_CARE_PROVIDER_SITE_OTHER): Payer: Medicare Other | Admitting: Family Medicine

## 2015-05-02 ENCOUNTER — Encounter: Payer: Self-pay | Admitting: Family Medicine

## 2015-05-02 VITALS — BP 142/80 | HR 80 | Temp 98.6°F | Resp 16 | Ht 63.0 in | Wt 124.0 lb

## 2015-05-02 DIAGNOSIS — M8580 Other specified disorders of bone density and structure, unspecified site: Secondary | ICD-10-CM

## 2015-05-02 DIAGNOSIS — Z0000 Encounter for general adult medical examination without abnormal findings: Secondary | ICD-10-CM | POA: Diagnosis not present

## 2015-05-02 DIAGNOSIS — Z23 Encounter for immunization: Secondary | ICD-10-CM

## 2015-05-02 NOTE — Progress Notes (Signed)
 Subjective:    Patient ID: Melissa Owen, female    DOB: 05/28/1946, 69 y.o.   MRN: [id number]  HPI  Subjective:   Patient presents for Medicare Annual/Subsequent preventive examination.   Review Past Medical/Family/Social: Past Medical History  Diagnosis Date  . Mitral valve prolapse   . Osteopenia 2012    T score - 2.2  . Anxiety    Past Surgical History  Procedure Laterality Date  . Carpal tunnel repair    . Tubal ligation    . Abdominal hysterectomy  age 43    Leiomyomata   Current Outpatient Prescriptions on File Prior to Visit  Medication Sig Dispense Refill  . ALPRAZolam (XANAX) 0.25 MG tablet TAKE 1 TABLET 3 TIMES A DAY AS NEEDED 90 tablet 2  . denosumab (PROLIA) 60 MG/ML SOLN injection Inject 60 mg into the skin every 6 (six) months. Administer in upper arm, thigh, or abdomen    . dicyclomine (BENTYL) 20 MG tablet Take 20 mg by mouth 3 (three) times daily as needed for spasms.    . estradiol (ESTRACE VAGINAL) 0.1 MG/GM vaginal cream Place 0.25 Applicatorfuls vaginally daily. 42.5 g 11  . estradiol (ESTRACE) 0.5 MG tablet TAKE 1 TABLET (0.5 MG TOTAL) BY MOUTH DAILY. 30 tablet 2  . famotidine (PEPCID) 20 MG tablet Take 20 mg by mouth 2 (two) times daily.    . Wheat Dextrin (BENEFIBER PO) Take by mouth.     No current facility-administered medications on file prior to visit.   Allergies  Allergen Reactions  . Aspirin   . Latex    Social History   Social History  . Marital Status: Divorced    Spouse Name: N/A  . Number of Children: N/A  . Years of Education: N/A   Occupational History  . Not on file.   Social History Main Topics  . Smoking status: Never Smoker   . Smokeless tobacco: Never Used  . Alcohol Use: 0.0 oz/week    0 Standard drinks or equivalent per week     Comment: Occasional  . Drug Use: No  . Sexual Activity: Yes     Comment: Divorced, dating but not in a monogamous relationship.  2 sons.   Other Topics Concern  . Not on file    Social History Narrative   Family History  Problem Relation Age of Onset  . Dementia Mother   . Heart disease Mother 47  . Cancer Maternal Grandmother     colon cancer  . Heart disease Father     Depression Screen  (Note: if answer to either of the following is "Yes", a more complete depression screening is indicated)  Over the past two weeks, have you felt down, depressed or hopeless? No Over the past two weeks, have you felt little interest or pleasure in doing things? No Have you lost interest or pleasure in daily life? No Do you often feel hopeless? No Do you cry easily over simple problems? No   Activities of Daily Living  In your present state of health, do you have any difficulty performing the following activities?:  Driving? No  Managing money? No  Feeding yourself? No  Getting from bed to chair? No  Climbing a flight of stairs? No  Preparing food and eating?: No  Bathing or showering? No  Getting dressed: No  Getting to the toilet? No  Using the toilet:No  Moving around from place to place: No  In the past year have you fallen or had   a near fall?:No  Are you sexually active? No  Do you have more than one partner? No   Hearing Difficulties: No  Do you often ask people to speak up or repeat themselves? No  Do you experience ringing or noises in your ears? No Do you have difficulty understanding soft or whispered voices? No  Do you feel that you have a problem with memory? No Do you often misplace items? No  Do you feel safe at home? Yes  Cognitive Testing  Alert? Yes Normal Appearance?Yes  Oriented to person? Yes Place? Yes  Time? Yes  Recall of three objects? Yes  Can perform simple calculations? Yes  Displays appropriate judgment?Yes  Can read the correct time from a watch face?Yes   Screening Tests / Date Colonoscopy    2013                 Zostavax 2010 Mammogram 6/16 Influenza Vaccine due Tetanus/tdap 2010  Review of Systems  All other  systems reviewed and are negative.      Objective:   Physical Exam  Constitutional: She is oriented to person, place, and time. She appears well-developed and well-nourished. No distress.  HENT:  Head: Normocephalic and atraumatic.  Right Ear: External ear normal.  Left Ear: External ear normal.  Nose: Nose normal.  Mouth/Throat: Oropharynx is clear and moist. No oropharyngeal exudate.  Eyes: Conjunctivae and EOM are normal. Pupils are equal, round, and reactive to light. Right eye exhibits no discharge. Left eye exhibits no discharge. No scleral icterus.  Neck: Normal range of motion. Neck supple. No JVD present. No tracheal deviation present. No thyromegaly present.  Cardiovascular: Normal rate, regular rhythm, normal heart sounds and intact distal pulses.  Exam reveals no gallop and no friction rub.   No murmur heard. Pulmonary/Chest: Effort normal and breath sounds normal. No stridor. No respiratory distress. She has no wheezes. She has no rales. She exhibits no tenderness.  Abdominal: Soft. Bowel sounds are normal. She exhibits no distension and no mass. There is no tenderness. There is no rebound and no guarding.  Musculoskeletal: Normal range of motion. She exhibits no edema or tenderness.  Lymphadenopathy:    She has no cervical adenopathy.  Neurological: She is alert and oriented to person, place, and time. She has normal reflexes. No cranial nerve deficit. She exhibits normal muscle tone. Coordination normal.  Skin: Skin is warm. No rash noted. She is not diaphoretic. No erythema. No pallor.  Psychiatric: She has a normal mood and affect. Her behavior is normal. Judgment and thought content normal.  Vitals reviewed.         Assessment & Plan:  Routine general medical examination at a health care facility  Osteopenia  patient's physical exam is completely normal. Her mammogram and colonoscopy are up-to-date. She is going to schedule herself to see Dr. Fontaine for her Pap  smear and pelvic exam. Give the patient the flu shot today.  Pneumovax 23 and Prevnar 13 are up-to-date. Her most recent lab work as listed below. Labs are significant only for a low vitamin D. Patient cannot tolerate vitamin D due to her irritable bowel syndrome. I have recommended that she begin to take 2000 units a day of vitamin D to try to replace her vitamin D stores. She is currently on prolia.  I will schedule the patient for repeat bone density test. Lab on 04/06/2015  Component Date Value Ref Range Status  . Vit D, 25-Hydroxy 04/06/2015 17* 30 - 100   ng/mL Final   Comment: Vitamin D Status           25-OH Vitamin D        Deficiency                <20 ng/mL        Insufficiency         20 - 29 ng/mL        Optimal             > or = 30 ng/mL   For 25-OH Vitamin D testing on patients on D2-supplementation and patients for whom quantitation of D2 and D3 fractions is required, the QuestAssureD 25-OH VIT D, (D2,D3), LC/MS/MS is recommended: order code [id number] (patients > 2 yrs).   . WBC 04/06/2015 5.7  4.0 - 10.5 K/uL Final  . RBC 04/06/2015 4.44  3.87 - 5.11 MIL/uL Final  . Hemoglobin 04/06/2015 13.8  12.0 - 15.0 g/dL Final  . HCT 04/06/2015 41.4  36.0 - 46.0 % Final  . MCV 04/06/2015 93.2  78.0 - 100.0 fL Final  . MCH 04/06/2015 31.1  26.0 - 34.0 pg Final  . MCHC 04/06/2015 33.3  30.0 - 36.0 g/dL Final  . RDW 04/06/2015 13.1  11.5 - 15.5 % Final  . Platelets 04/06/2015 274  150 - 400 K/uL Final  . MPV 04/06/2015 10.2  8.6 - 12.4 fL Final  . Neutrophils Relative % 04/06/2015 55  43 - 77 % Final  . Neutro Abs 04/06/2015 3.1  1.7 - 7.7 K/uL Final  . Lymphocytes Relative 04/06/2015 36  12 - 46 % Final  . Lymphs Abs 04/06/2015 2.1  0.7 - 4.0 K/uL Final  . Monocytes Relative 04/06/2015 7  3 - 12 % Final  . Monocytes Absolute 04/06/2015 0.4  0.1 - 1.0 K/uL Final  . Eosinophils Relative 04/06/2015 2  0 - 5 % Final  . Eosinophils Absolute 04/06/2015 0.1  0.0 - 0.7 K/uL Final  . Basophils  Relative 04/06/2015 0  0 - 1 % Final  . Basophils Absolute 04/06/2015 0.0  0.0 - 0.1 K/uL Final  . Smear Review 04/06/2015 Criteria for review not met   Final  . Cholesterol 04/06/2015 257* 125 - 200 mg/dL Final  . Triglycerides 04/06/2015 122  <150 mg/dL Final  . HDL 04/06/2015 108  >=46 mg/dL Final  . Total CHOL/HDL Ratio 04/06/2015 2.4  <=5.0 Ratio Final  . VLDL 04/06/2015 24  <30 mg/dL Final  . LDL Cholesterol 04/06/2015 125  <130 mg/dL Final   Comment:   Total Cholesterol/HDL Ratio:CHD Risk                        Coronary Heart Disease Risk Table                                        Men       Women          1/2 Average Risk              3.4        3.3              Average Risk              5.0        4.4           2X  Average Risk              9.6        7.1           3X Average Risk             23.4       11.0 Use the calculated Patient Ratio above and the CHD Risk table  to determine the patient's CHD Risk.   Marland Kitchen TSH 04/06/2015 2.988  0.350 - 4.500 uIU/mL Final  . Sodium 04/06/2015 140  135 - 146 mmol/L Final  . Potassium 04/06/2015 4.1  3.5 - 5.3 mmol/L Final  . Chloride 04/06/2015 102  98 - 110 mmol/L Final  . CO2 04/06/2015 26  20 - 31 mmol/L Final  . Glucose, Bld 04/06/2015 86  70 - 99 mg/dL Final  . BUN 04/06/2015 11  7 - 25 mg/dL Final  . Creat 04/06/2015 0.68  0.60 - 0.93 mg/dL Final  . Total Bilirubin 04/06/2015 0.5  0.2 - 1.2 mg/dL Final  . Alkaline Phosphatase 04/06/2015 54  33 - 130 U/L Final  . AST 04/06/2015 16  10 - 35 U/L Final  . ALT 04/06/2015 14  6 - 29 U/L Final  . Total Protein 04/06/2015 7.2  6.1 - 8.1 g/dL Final  . Albumin 04/06/2015 4.3  3.6 - 5.1 g/dL Final  . Calcium 04/06/2015 9.2  8.6 - 10.4 mg/dL Final  . GFR, Est African American 04/06/2015 >89  >=60 mL/min Final  . GFR, Est Non African American 04/06/2015 89  >=60 mL/min Final   Comment:   The estimated GFR is a calculation valid for adults (>=12 years old) that uses the CKD-EPI algorithm to  adjust for age and sex. It is   not to be used for children, pregnant women, hospitalized patients,    patients on dialysis, or with rapidly changing kidney function. According to the NKDEP, eGFR >89 is normal, 60-89 shows mild impairment, 30-59 shows moderate impairment, 15-29 shows severe impairment and <15 is ESRD.       Medicare Attestation  I have personally reviewed:  The patient's medical and social history  Their use of alcohol, tobacco or illicit drugs  Their current medications and supplements  The patient's functional ability including ADLs,fall risks, home safety risks, cognitive, and hearing and visual impairment  Diet and physical activities  Evidence for depression or mood disorders  The patient's weight, height, BMI, and visual acuity have been recorded in the chart. I have made referrals, counseling, and provided education to the patient based on review of the above and I have provided the patient with a written personalized care plan for preventive services.

## 2015-05-02 NOTE — Addendum Note (Signed)
Addended by: Shary Decamp B on: 05/02/2015 04:29 PM   Modules accepted: Orders

## 2015-05-21 ENCOUNTER — Other Ambulatory Visit: Payer: Self-pay | Admitting: Family Medicine

## 2015-05-22 NOTE — Telephone Encounter (Signed)
Ok to refill??  Last office visit 05/02/2015.  Last refill 02/16/2015, #2 refills.

## 2015-05-22 NOTE — Telephone Encounter (Signed)
ok 

## 2015-05-22 NOTE — Telephone Encounter (Signed)
Medication called to pharmacy. 

## 2015-05-23 ENCOUNTER — Encounter: Payer: Self-pay | Admitting: *Deleted

## 2015-06-06 ENCOUNTER — Encounter: Payer: Medicare Other | Admitting: Gynecology

## 2015-06-11 DIAGNOSIS — M8580 Other specified disorders of bone density and structure, unspecified site: Secondary | ICD-10-CM

## 2015-06-11 HISTORY — DX: Other specified disorders of bone density and structure, unspecified site: M85.80

## 2015-06-22 ENCOUNTER — Telehealth: Payer: Self-pay | Admitting: Family Medicine

## 2015-06-22 NOTE — Telephone Encounter (Signed)
Patient is calling to ask questions regarding her xanax and her insurance, and also has changed her pharmacy to [facility] on [address]  [phone]

## 2015-06-23 NOTE — Telephone Encounter (Signed)
PA was submitted through [email or web address] on 06/22/15

## 2015-06-26 NOTE — Telephone Encounter (Signed)
PA has been approved awaiting paperwork for dates

## 2015-06-29 ENCOUNTER — Ambulatory Visit
Admission: RE | Admit: 2015-06-29 | Discharge: 2015-06-29 | Disposition: A | Discharge status: Home / Self Care | Payer: Medicare Other | Source: Ambulatory Visit | Attending: Family Medicine | Admitting: Family Medicine

## 2015-06-29 DIAGNOSIS — M8580 Other specified disorders of bone density and structure, unspecified site: Secondary | ICD-10-CM

## 2015-06-29 DIAGNOSIS — M85852 Other specified disorders of bone density and structure, left thigh: Secondary | ICD-10-CM | POA: Diagnosis not present

## 2015-06-29 NOTE — Telephone Encounter (Signed)
Pt aware.

## 2015-07-05 NOTE — Telephone Encounter (Signed)
Approved through 06/22/2016

## 2015-07-07 ENCOUNTER — Encounter: Payer: Self-pay | Admitting: Family Medicine

## 2015-07-11 ENCOUNTER — Encounter: Payer: Self-pay | Admitting: Gynecology

## 2015-07-11 ENCOUNTER — Ambulatory Visit (INDEPENDENT_AMBULATORY_CARE_PROVIDER_SITE_OTHER): Payer: Medicare Other | Admitting: Gynecology

## 2015-07-11 VITALS — BP 124/80 | Ht 61.0 in | Wt 122.0 lb

## 2015-07-11 DIAGNOSIS — N8110 Cystocele, unspecified: Secondary | ICD-10-CM

## 2015-07-11 DIAGNOSIS — M8580 Other specified disorders of bone density and structure, unspecified site: Secondary | ICD-10-CM | POA: Diagnosis not present

## 2015-07-11 DIAGNOSIS — Z01419 Encounter for gynecological examination (general) (routine) without abnormal findings: Secondary | ICD-10-CM

## 2015-07-11 DIAGNOSIS — Z79890 Hormone replacement therapy (postmenopausal): Secondary | ICD-10-CM | POA: Diagnosis not present

## 2015-07-11 DIAGNOSIS — N952 Postmenopausal atrophic vaginitis: Secondary | ICD-10-CM | POA: Diagnosis not present

## 2015-07-11 DIAGNOSIS — IMO0002 Reserved for concepts with insufficient information to code with codable children: Secondary | ICD-10-CM

## 2015-07-11 MED ORDER — FLUCONAZOLE 150 MG PO TABS: 150.0000 mg | ORAL_TABLET | Freq: Once | ORAL | Status: DC

## 2015-07-11 MED ORDER — ESTRADIOL 0.1 MG/GM VA CREA: 2.0000 g | TOPICAL_CREAM | Freq: Every day | VAGINAL | Status: DC

## 2015-07-11 NOTE — Progress Notes (Signed)
Melissa Owen 1945/06/24 [id number]        69 y.o.  [id number] for breast and pelvic exam. Several issues noted below.  Past medical history,surgical history, problem list, medications, allergies, family history and social history were all reviewed and documented as reviewed in the EPIC chart.  ROS:  Performed with pertinent positives and negatives included in the history, assessment and plan.   Additional significant findings :  none   Exam: Caryn Bee assistant Filed Vitals:   07/11/15 1446  BP: 124/80  Height: 5\' 1"  (1.549 m)  Weight: 122 lb (55.339 kg)   General appearance:  Normal affect, orientation and appearance. Skin: Grossly normal HEENT: Without gross lesions.  No cervical or supraclavicular adenopathy. Thyroid normal.  Lungs:  Clear without wheezing, rales or rhonchi Cardiac: RR, without RMG Abdominal:  Soft, nontender, without masses, guarding, rebound, organomegaly or hernia Breasts:  Examined lying and sitting without masses, retractions, discharge or axillary adenopathy. Pelvic:  Ext/BUS/vagina with atrophic changes  Adnexa without masses or tenderness    Anus and perineum normal   Rectovaginal normal sphincter tone without palpated masses or tenderness.    Assessment/Plan:  70 y.o. [id number] female for breast and pelvic exam.   1. Vaginal irritation. Patient complaining of several weeks of vaginal irritation with slight discharge. Exam overall is normal with atrophic changes. No significant discharge or irritation on exam.  Past history of yeast. Will arbitrarily treat with Diflucan 150 mg 1 dose. Follow up if symptoms persist or recur. 2. Postmenopausal/atrophic genital changes. Status post TAH for leiomyoma years ago. On estradiol 0.5 mg and Estrace vaginal cream twice weekly. Had tried to stop her estradiol but had significant hot flashes and not feeling well. She reinitiated and Dr. Dennard Schaumann refilled her 1 year. She does need a refill on her Estrace vaginal cream.  I again reviewed the whole issue of ERT and risks to include increased risk of stroke heart attack DVT and possible breast cancer. Current recommendations for lowest dose for shortness period of time. Possible increased risks associated with advancing age all discussed. Patient feels that his quality of life issue and accepts the risks and wants to continue. 3. Osteopenia. DEXA 06/2015 T score -2.0. Had transiently tried Prolia but discontinued last year. Bone density seems same/improved from prior. Patient is awaiting a call from Dr. Dennard Schaumann as far as management but certainly looks like probably he will continue to observe her. She'll follow up with him for his recommendations. 4. Pap smear  2012. No Pap smear done today. No history of significant abnormal Pap smears. Status post hysterectomy for benign indications. Over the age of 62. Will stop screening per current screening guidelines. 5. Mammography 11/2014. Continue with annual mammography when due. SBE monthly reviewed. 6. Colonoscopy 2012. Repeat at their recommended interval. 7. Health maintenance. No routine lab work done as patient does this through her primary physician's office. Follow up in one year, sooner as needed.   Anastasio Auerbach MD, 3:18 PM 07/11/2015

## 2015-07-11 NOTE — Patient Instructions (Signed)
Take the Diflucan pill for the vaginal irritation. Follow up if it continues.   Hormone Therapy At menopause, your body begins making less estrogen and progesterone hormones. This causes the body to stop having menstrual periods. This is because estrogen and progesterone hormones control your periods and menstrual cycle. A lack of estrogen may cause symptoms such as:  Hot flushes (or hot flashes).  Vaginal dryness.  Dry skin.  Loss of sex drive.  Risk of bone loss (osteoporosis). When this happens, you may choose to take hormone therapy to get back the estrogen lost during menopause. When the hormone estrogen is given alone, it is usually referred to as ET (Estrogen Therapy). When the hormone progestin is combined with estrogen, it is generally called HT (Hormone Therapy). This was formerly known as hormone replacement therapy (HRT). Your caregiver can help you make a decision on what will be best for you. The decision to use HT seems to change often as new studies are done. Many studies do not agree on the benefits of hormone replacement therapy. LIKELY BENEFITS OF HT INCLUDE PROTECTION FROM:  Hot Flushes (also called hot flashes) - A hot flush is a sudden feeling of heat that spreads over the face and body. The skin may redden like a blush. It is connected with sweats and sleep disturbance. Women going through menopause may have hot flushes a few times a month or several times per day depending on the woman.  Osteoporosis (bone loss) - Estrogen helps guard against bone loss. After menopause, a woman's bones slowly lose calcium and become weak and brittle. As a result, bones are more likely to break. The hip, wrist, and spine are affected most often. Hormone therapy can help slow bone loss after menopause. Weight bearing exercise and taking calcium with vitamin D also can help prevent bone loss. There are also medications that your caregiver can prescribe that can help prevent  osteoporosis.  Vaginal dryness - Loss of estrogen causes changes in the vagina. Its lining may become thin and dry. These changes can cause pain and bleeding during sexual intercourse. Dryness can also lead to infections. This can cause burning and itching. (Vaginal estrogen treatment can help relieve pain, itching, and dryness.)  Urinary tract infections are more common after menopause because of lack of estrogen. Some women also develop urinary incontinence because of low estrogen levels in the vagina and bladder.  Possible other benefits of estrogen include a positive effect on mood and short-term memory in women. RISKS AND COMPLICATIONS  Using estrogen alone without progesterone causes the lining of the uterus to grow. This increases the risk of lining of the uterus (endometrial) cancer. Your caregiver should give another hormone called progestin if you have a uterus.  Women who take combined (estrogen and progestin) HT appear to have an increased risk of breast cancer. The risk appears to be small, but increases throughout the time that HT is taken.  Combined therapy also makes the breast tissue slightly denser which makes it harder to read mammograms (breast X-rays).  Combined, estrogen and progesterone therapy can be taken together every day, in which case there may be spotting of blood. HT therapy can be taken cyclically in which case you will have menstrual periods. Cyclically means HT is taken for a set amount of days, then not taken, then this process is repeated.  HT may increase the risk of stroke, heart attack, breast cancer and forming blood clots in your leg.  Transdermal estrogen (estrogen that is  absorbed through the skin with a patch or a cream) may have better results with:  Cholesterol.  Blood pressure.  Blood clots. Having the following conditions may indicate you should not have HT:  Endometrial cancer.  Liver disease.  Breast cancer.  Heart  disease.  History of blood clots.  Stroke. TREATMENT   If you choose to take HT and have a uterus, usually estrogen and progestin are prescribed.  Your caregiver will help you decide the best way to take the medications.  Possible ways to take estrogen include:  Pills.  Patches.  Gels.  Sprays.  Vaginal estrogen cream, rings and tablets.  It is best to take the lowest dose possible that will help your symptoms and take them for the shortest period of time that you can.  Hormone therapy can help relieve some of the problems (symptoms) that affect women at menopause. Before making a decision about HT, talk to your caregiver about what is best for you. Be well informed and comfortable with your decisions. HOME CARE INSTRUCTIONS   Follow your caregivers advice when taking the medications.  A Pap test is done to screen for cervical cancer.  The first Pap test should be done at age 61.  Between ages 34 and 29, Pap tests are repeated every 2 years.  Beginning at age 36, you are advised to have a Pap test every 3 years as long as the past 3 Pap tests have been normal.  Some women have medical problems that increase the chance of getting cervical cancer. Talk to your caregiver about these problems. It is especially important to talk to your caregiver if a new problem develops soon after your last Pap test. In these cases, your caregiver may recommend more frequent screening and Pap tests.  The above recommendations are the same for women who have or have not gotten the vaccine for HPV (human papillomavirus).  If you had a hysterectomy for a problem that was not a cancer or a condition that could lead to cancer, then you no longer need Pap tests. However, even if you no longer need a Pap test, a regular exam is a good idea to make sure no other problems are starting.  If you are between ages 54 and 71, and you have had normal Pap tests going back 10 years, you no longer need Pap  tests. However, even if you no longer need a Pap test, a regular exam is a good idea to make sure no other problems are starting.  If you have had past treatment for cervical cancer or a condition that could lead to cancer, you need Pap tests and screening for cancer for at least 20 years after your treatment.  If Pap tests have been discontinued, risk factors (such as a new sexual partner)need to be re-assessed to determine if screening should be resumed.  Some women may need screenings more often if they are at high risk for cervical cancer.  Get mammograms done as per the advice of your caregiver. SEEK IMMEDIATE MEDICAL CARE IF:  You develop abnormal vaginal bleeding.  You have pain or swelling in your legs, shortness of breath, or chest pain.  You develop dizziness or headaches.  You have lumps or changes in your breasts or armpits.  You have slurred speech.  You develop weakness or numbness of your arms or legs.  You have pain, burning, or bleeding when urinating.  You develop abdominal pain.   This information is not intended to  replace advice given to you by your health care provider. Make sure you discuss any questions you have with your health care provider.   Document Released: 02/23/2003 Document Revised: 10/11/2014 Document Reviewed: 11/28/2014 Elsevier Interactive Patient Education [employer].

## 2015-08-16 ENCOUNTER — Other Ambulatory Visit: Payer: Self-pay | Admitting: Family Medicine

## 2015-08-16 NOTE — Telephone Encounter (Signed)
?   OK to Refill  

## 2015-08-17 NOTE — Telephone Encounter (Signed)
rx called in

## 2015-08-17 NOTE — Telephone Encounter (Signed)
ok 

## 2015-09-23 ENCOUNTER — Other Ambulatory Visit: Payer: Self-pay | Admitting: Family Medicine

## 2015-10-03 ENCOUNTER — Other Ambulatory Visit: Payer: Self-pay | Admitting: Family Medicine

## 2015-10-03 NOTE — Telephone Encounter (Signed)
Ok to refill??  Last office visit 05/02/2015.  Last refill 08/17/2015.

## 2015-10-05 NOTE — Telephone Encounter (Signed)
ok 

## 2015-10-31 DIAGNOSIS — K5900 Constipation, unspecified: Secondary | ICD-10-CM | POA: Diagnosis not present

## 2015-10-31 DIAGNOSIS — K582 Mixed irritable bowel syndrome: Secondary | ICD-10-CM | POA: Diagnosis not present

## 2015-11-13 DIAGNOSIS — Z1231 Encounter for screening mammogram for malignant neoplasm of breast: Secondary | ICD-10-CM | POA: Diagnosis not present

## 2015-11-13 LAB — HM MAMMOGRAPHY

## 2015-11-17 ENCOUNTER — Encounter: Payer: Self-pay | Admitting: Family Medicine

## 2016-01-10 ENCOUNTER — Telehealth: Payer: Self-pay | Admitting: Family Medicine

## 2016-01-10 NOTE — Telephone Encounter (Signed)
Requesting a refill on Xanax - Ok to refill??       

## 2016-01-11 MED ORDER — ALPRAZOLAM 0.25 MG PO TABS: 0.2500 mg | ORAL_TABLET | Freq: Three times a day (TID) | ORAL | 2 refills | Status: DC | PRN

## 2016-01-11 NOTE — Telephone Encounter (Signed)
Medication called/sent to requested pharmacy  

## 2016-01-11 NOTE — Telephone Encounter (Signed)
ok 

## 2016-02-13 ENCOUNTER — Other Ambulatory Visit: Payer: Self-pay

## 2016-02-27 DIAGNOSIS — Z23 Encounter for immunization: Secondary | ICD-10-CM | POA: Diagnosis not present

## 2016-03-20 ENCOUNTER — Other Ambulatory Visit: Payer: Self-pay | Admitting: Family Medicine

## 2016-05-07 ENCOUNTER — Ambulatory Visit (INDEPENDENT_AMBULATORY_CARE_PROVIDER_SITE_OTHER): Payer: Medicare Other | Admitting: Family Medicine

## 2016-05-07 ENCOUNTER — Encounter: Payer: Self-pay | Admitting: Family Medicine

## 2016-05-07 VITALS — BP 136/84 | HR 80 | Temp 97.9°F | Resp 16 | Ht 63.0 in | Wt 122.0 lb

## 2016-05-07 DIAGNOSIS — M8580 Other specified disorders of bone density and structure, unspecified site: Secondary | ICD-10-CM | POA: Diagnosis not present

## 2016-05-07 DIAGNOSIS — Z0000 Encounter for general adult medical examination without abnormal findings: Secondary | ICD-10-CM

## 2016-05-07 DIAGNOSIS — Z13220 Encounter for screening for lipoid disorders: Secondary | ICD-10-CM | POA: Diagnosis not present

## 2016-05-07 LAB — CBC WITH DIFFERENTIAL/PLATELET
BASOS PCT: 0 %
Basophils Absolute: 0 cells/uL (ref 0–200)
EOS PCT: 1 %
Eosinophils Absolute: 64 cells/uL (ref 15–500)
HEMATOCRIT: 41.9 % (ref 35.0–45.0)
Hemoglobin: 13.4 g/dL (ref 12.0–15.0)
LYMPHS PCT: 24 %
Lymphs Abs: 1536 cells/uL (ref 850–3900)
MCH: 30 pg (ref 27.0–33.0)
MCHC: 32 g/dL (ref 32.0–36.0)
MCV: 93.7 fL (ref 80.0–100.0)
MONO ABS: 384 {cells}/uL (ref 200–950)
MONOS PCT: 6 %
MPV: 10.4 fL (ref 7.5–12.5)
NEUTROS PCT: 69 %
Neutro Abs: 4416 cells/uL (ref 1500–7800)
PLATELETS: 271 10*3/uL (ref 140–400)
RBC: 4.47 MIL/uL (ref 3.80–5.10)
RDW: 12.8 % (ref 11.0–15.0)
WBC: 6.4 10*3/uL (ref 3.8–10.8)

## 2016-05-07 NOTE — Progress Notes (Signed)
Subjective:    Patient ID: Melissa Owen, female    DOB: Mar 07, 1946, 70 y.o.   MRN: [id number]  HPI Subjective:   Patient presents for Medicare Annual/Subsequent preventive examination.   Review Past Medical/Family/Social: Past Medical History:  Diagnosis Date  . Anxiety   . Mitral valve prolapse   . Osteopenia 06/2015   T score - 2.0   Past Surgical History:  Procedure Laterality Date  . ABDOMINAL HYSTERECTOMY  age 25   Leiomyomata  . carpal tunnel repair    . TUBAL LIGATION     Current Outpatient Prescriptions on File Prior to Visit  Medication Sig Dispense Refill  . ALPRAZolam (XANAX) 0.25 MG tablet Take 1 tablet (0.25 mg total) by mouth 3 (three) times daily as needed. 90 tablet 2  . Cholecalciferol (VITAMIN D PO) Take by mouth.    . estradiol (ESTRACE VAGINAL) 0.1 MG/GM vaginal cream Place [id number] Applicatorfuls vaginally daily. 42.5 g 11  . estradiol (ESTRACE) 0.5 MG tablet TAKE 1 TABLET BY MOUTH EVERY DAY 30 tablet 5  . famotidine (PEPCID) 20 MG tablet Take 20 mg by mouth 2 (two) times daily.    Marland Kitchen lubiprostone (AMITIZA) 24 MCG capsule Take 24 mcg by mouth 2 (two) times daily with a meal.    . Probiotic Product (PROBIOTIC PO) Take by mouth.    . Wheat Dextrin (BENEFIBER PO) Take by mouth.     No current facility-administered medications on file prior to visit.    Allergies  Allergen Reactions  . Aspirin Nausea And Vomiting  . Latex Itching   Social History   Social History  . Marital status: Divorced    Spouse name: N/A  . Number of children: N/A  . Years of education: N/A   Occupational History  . Not on file.   Social History Main Topics  . Smoking status: Never Smoker  . Smokeless tobacco: Never Used  . Alcohol use 0.0 oz/week     Comment: Occasional  . Drug use: No  . Sexual activity: Yes     Comment: 1st intercourse 70 yo-Fewer than 5 partners   Other Topics Concern  . Not on file   Social History Narrative  . No narrative on file   Family  History  Problem Relation Age of Onset  . Dementia Mother   . Heart disease Mother 24  . Heart disease Father   . Cancer Maternal Grandmother     colon cancer    Depression Screen  (Note: if answer to either of the following is "Yes", a more complete depression screening is indicated)  Over the past two weeks, have you felt down, depressed or hopeless? No Over the past two weeks, have you felt little interest or pleasure in doing things? No Have you lost interest or pleasure in daily life? No Do you often feel hopeless? No Do you cry easily over simple problems? No   Activities of Daily Living  In your present state of health, do you have any difficulty performing the following activities?:  Driving? No  Managing money? No  Feeding yourself? No  Getting from bed to chair? No  Climbing a flight of stairs? No  Preparing food and eating?: No  Bathing or showering? No  Getting dressed: No  Getting to the toilet? No  Using the toilet:No  Moving around from place to place: No  In the past year have you fallen or had a near fall?:No  Are you sexually active? No  Do you  have more than one partner? No   Hearing Difficulties: No  Do you often ask people to speak up or repeat themselves? No  Do you experience ringing or noises in your ears? No Do you have difficulty understanding soft or whispered voices? No  Do you feel that you have a problem with memory? No Do you often misplace items? No  Do you feel safe at home? Yes  Cognitive Testing  Alert? Yes Normal Appearance?Yes  Oriented to person? Yes Place? Yes  Time? Yes  Recall of three objects? Yes  Can perform simple calculations? Yes  Displays appropriate judgment?Yes  Can read the correct time from a watch face?Yes   Screening Tests / Date Colonoscopy    2011               Zostavax 2010 Mammogram 6/17 Influenza Vaccine-UTD Tetanus/tdap 2010  Review of Systems  All other systems reviewed and are negative.        Objective:   Physical Exam  Constitutional: She is oriented to person, place, and time. She appears well-developed and well-nourished. No distress.  HENT:  Head: Normocephalic and atraumatic.  Right Ear: External ear normal.  Left Ear: External ear normal.  Nose: Nose normal.  Mouth/Throat: Oropharynx is clear and moist. No oropharyngeal exudate.  Eyes: Conjunctivae and EOM are normal. Pupils are equal, round, and reactive to light. Right eye exhibits no discharge. Left eye exhibits no discharge. No scleral icterus.  Neck: Normal range of motion. Neck supple. No JVD present. No tracheal deviation present. No thyromegaly present.  Cardiovascular: Normal rate, regular rhythm, normal heart sounds and intact distal pulses.  Exam reveals no gallop and no friction rub.   No murmur heard. Pulmonary/Chest: Effort normal and breath sounds normal. No stridor. No respiratory distress. She has no wheezes. She has no rales. She exhibits no tenderness.  Abdominal: Soft. Bowel sounds are normal. She exhibits no distension and no mass. There is no tenderness. There is no rebound and no guarding.  Musculoskeletal: Normal range of motion. She exhibits no edema or tenderness.  Lymphadenopathy:    She has no cervical adenopathy.  Neurological: She is alert and oriented to person, place, and time. She has normal reflexes. No cranial nerve deficit. She exhibits normal muscle tone. Coordination normal.  Skin: Skin is warm. No rash noted. She is not diaphoretic. No erythema. No pallor.  Psychiatric: She has a normal mood and affect. Her behavior is normal. Judgment and thought content normal.  Vitals reviewed.         Assessment & Plan:  Screening cholesterol level - Plan: CBC with Differential/Platelet, COMPLETE METABOLIC PANEL WITH GFR, Lipid panel  Routine general medical examination at a health care facility  Osteopenia, unspecified location  patient's physical exam is completely normal. Her  mammogram and colonoscopy are up-to-date. The patient's immunizations are up-to-date Immunization History  Administered Date(s) Administered  . Influenza Whole 03/10/2009  . Influenza, High Dose Seasonal PF 02/25/2016  . Influenza,inj,Quad PF,36+ Mos 02/16/2013, 03/14/2014, 05/02/2015  . Pneumococcal Conjugate-13 03/14/2014  . Pneumococcal Polysaccharide-23 03/30/2009  . Tdap 12/19/2008  . Zoster 12/19/2008  given her age and her history of a hysterectomy, she no longer requires Pap smear. Blood pressures borderline elevated. She will monitor her blood pressure at home and notify me if greater than 140/90. She is taking calcium and vitamin D for osteopenia. We will repeat a bone density in 2019. Otherwise no changes in therapy at this time Medicare Attestation  I have  personally reviewed:  The patient's medical and social history  Their use of alcohol, tobacco or illicit drugs  Their current medications and supplements  The patient's functional ability including ADLs,fall risks, home safety risks, cognitive, and hearing and visual impairment  Diet and physical activities  Evidence for depression or mood disorders  The patient's weight, height, BMI, and visual acuity have been recorded in the chart. I have made referrals, counseling, and provided education to the patient based on review of the above and I have provided the patient with a written personalized care plan for preventive services.

## 2016-05-08 LAB — LIPID PANEL
CHOLESTEROL: 231 mg/dL — AB (ref <200)
HDL: 91 mg/dL (ref >50)
LDL CALC: 114 mg/dL — AB (ref <100)
TRIGLYCERIDES: 128 mg/dL (ref <150)
Total CHOL/HDL Ratio: 2.5 Ratio (ref <5.0)
VLDL: 26 mg/dL (ref <30)

## 2016-05-08 LAB — COMPLETE METABOLIC PANEL WITH GFR
ALT: 9 U/L (ref 6–29)
AST: 14 U/L (ref 10–35)
Albumin: 4.3 g/dL (ref 3.6–5.1)
Alkaline Phosphatase: 58 U/L (ref 33–130)
BUN: 10 mg/dL (ref 7–25)
CALCIUM: 9.4 mg/dL (ref 8.6–10.4)
CHLORIDE: 101 mmol/L (ref 98–110)
CO2: 29 mmol/L (ref 20–31)
Creat: 0.72 mg/dL (ref 0.60–0.93)
GFR, Est African American: >89 mL/min (ref >=60)
GFR, Est Non African American: 85 mL/min (ref >=60)
Glucose, Bld: 98 mg/dL (ref 70–99)
POTASSIUM: 4.1 mmol/L (ref 3.5–5.3)
Sodium: 139 mmol/L (ref 135–146)
Total Bilirubin: 0.5 mg/dL (ref 0.2–1.2)
Total Protein: 7.2 g/dL (ref 6.1–8.1)

## 2016-05-09 ENCOUNTER — Encounter: Payer: Self-pay | Admitting: Family Medicine

## 2016-05-09 DIAGNOSIS — D225 Melanocytic nevi of trunk: Secondary | ICD-10-CM | POA: Diagnosis not present

## 2016-05-09 DIAGNOSIS — L814 Other melanin hyperpigmentation: Secondary | ICD-10-CM | POA: Diagnosis not present

## 2016-05-09 DIAGNOSIS — L821 Other seborrheic keratosis: Secondary | ICD-10-CM | POA: Diagnosis not present

## 2016-05-09 DIAGNOSIS — L853 Xerosis cutis: Secondary | ICD-10-CM | POA: Diagnosis not present

## 2016-05-10 ENCOUNTER — Telehealth: Payer: Self-pay | Admitting: Family Medicine

## 2016-05-10 MED ORDER — ALPRAZOLAM 0.25 MG PO TABS: 0.2500 mg | ORAL_TABLET | Freq: Three times a day (TID) | ORAL | 2 refills | Status: DC | PRN

## 2016-05-10 NOTE — Telephone Encounter (Signed)
Requesting refill on Xanax - Ok to refill??      Last refill 01/11/16

## 2016-05-10 NOTE — Telephone Encounter (Signed)
Approved for same quantity is last Rx.

## 2016-05-10 NOTE — Telephone Encounter (Signed)
Medication called/sent to requested pharmacy  

## 2016-07-11 ENCOUNTER — Ambulatory Visit (INDEPENDENT_AMBULATORY_CARE_PROVIDER_SITE_OTHER): Payer: Medicare Other | Admitting: Gynecology

## 2016-07-11 ENCOUNTER — Telehealth: Payer: Self-pay | Admitting: *Deleted

## 2016-07-11 ENCOUNTER — Encounter: Payer: Self-pay | Admitting: Gynecology

## 2016-07-11 VITALS — BP 122/76 | Ht 61.0 in | Wt 121.0 lb

## 2016-07-11 DIAGNOSIS — N952 Postmenopausal atrophic vaginitis: Secondary | ICD-10-CM | POA: Diagnosis not present

## 2016-07-11 DIAGNOSIS — N393 Stress incontinence (female) (male): Secondary | ICD-10-CM | POA: Diagnosis not present

## 2016-07-11 DIAGNOSIS — Z9189 Other specified personal risk factors, not elsewhere classified: Secondary | ICD-10-CM | POA: Diagnosis not present

## 2016-07-11 DIAGNOSIS — Z01411 Encounter for gynecological examination (general) (routine) with abnormal findings: Secondary | ICD-10-CM | POA: Diagnosis not present

## 2016-07-11 DIAGNOSIS — M8580 Other specified disorders of bone density and structure, unspecified site: Secondary | ICD-10-CM | POA: Diagnosis not present

## 2016-07-11 DIAGNOSIS — Z79890 Hormone replacement therapy (postmenopausal): Secondary | ICD-10-CM | POA: Diagnosis not present

## 2016-07-11 LAB — URINALYSIS W MICROSCOPIC + REFLEX CULTURE
BACTERIA UA: NONE SEEN [HPF]
Bilirubin Urine: NEGATIVE
Casts: NONE SEEN [LPF]
Crystals: NONE SEEN [HPF]
GLUCOSE, UA: NEGATIVE
Hgb urine dipstick: NEGATIVE
Ketones, ur: NEGATIVE
LEUKOCYTES UA: NEGATIVE
Nitrite: NEGATIVE
PROTEIN: NEGATIVE
RBC / HPF: NONE SEEN RBC/HPF (ref <=2)
Specific Gravity, Urine: 1.005 (ref 1.001–1.035)
WBC, UA: NONE SEEN WBC/HPF (ref <=5)
Yeast: NONE SEEN [HPF]
pH: 6 (ref 5.0–8.0)

## 2016-07-11 MED ORDER — NONFORMULARY OR COMPOUNDED ITEM: 3 refills | Status: DC

## 2016-07-11 NOTE — Telephone Encounter (Signed)
Rx called in 

## 2016-07-11 NOTE — Telephone Encounter (Signed)
-----   Message from Anastasio Auerbach, MD sent at 07/11/2016  9:12 AM EST ----- [facility]radiol vaginal cream applicators twice weekly 3 month supply refill 1 year

## 2016-07-11 NOTE — Patient Instructions (Signed)
We will call a prescription in for the vaginal estrogen cream to [place]   Kegel Exercises The goal of Kegel exercises is to isolate and exercise your pelvic floor muscles. These muscles act as a hammock that supports the rectum, vagina, small intestine, and uterus. As the muscles weaken, the hammock sags and these organs are displaced from their normal positions. Kegel exercises can strengthen your pelvic floor muscles and help you to improve bladder and bowel control, improve sexual response, and help reduce many problems and some discomfort during pregnancy. Kegel exercises can be done anywhere and at any time. HOW TO PERFORM KEGEL EXERCISES 1. Locate your pelvic floor muscles. To do this, squeeze (contract) the muscles that you use when you try to stop the flow of urine. You will feel a tightness in the vaginal area (women) and a tight lift in the rectal area (men and women). 2. When you begin, contract your pelvic muscles tight for 2-5 seconds, then relax them for 2-5 seconds. This is one set. Do 4-5 sets with a short pause in between. 3. Contract your pelvic muscles for 8-10 seconds, then relax them for 8-10 seconds. Do 4-5 sets. If you cannot contract your pelvic muscles for 8-10 seconds, try 5-7 seconds and work your way up to 8-10 seconds. Your goal is 4-5 sets of 10 contractions each day. Keep your stomach, buttocks, and legs relaxed during the exercises. Perform sets of both short and long contractions. Vary your positions. Perform these contractions 3-4 times per day. Perform sets while you are:   Lying in bed in the morning.  Standing at lunch.  Sitting in the late afternoon.  Lying in bed at night. You should do 40-50 contractions per day. Do not perform more Kegel exercises per day than recommended. Overexercising can cause muscle fatigue. Continue these exercises for for at least 15-20 weeks or as directed by your caregiver. This information is not intended to  replace advice given to you by your health care provider. Make sure you discuss any questions you have with your health care provider. Document Released: 05/13/2012 Document Revised: 06/17/2014 Document Reviewed: 04/16/2015 Elsevier Interactive Patient Education  2017 [employer].

## 2016-07-11 NOTE — Progress Notes (Signed)
    Melissa Owen 03-02-1946 [id number]        70 y.o.  [id number] for breast and pelvic exam. Patient also complaining of urinary incontinence. Over the past year she has noted loss of urine with coughing laughing sneezing. No urgency frequency dysuria low back pain fever or chills. No issues with fecal incontinence.  Past medical history,surgical history, problem list, medications, allergies, family history and social history were all reviewed and documented as reviewed in the EPIC chart.  ROS:  Performed with pertinent positives and negatives included in the history, assessment and plan.   Additional significant findings :  None   Exam: Caryn Bee assistant Vitals:   07/11/16 0841  BP: 122/76  Weight: 121 lb (54.9 kg)  Height: 5\' 1"  (1.549 m)   Body mass index is 22.86 kg/m.  General appearance:  Normal affect, orientation and appearance. Skin: Grossly normal HEENT: Without gross lesions.  No cervical or supraclavicular adenopathy. Thyroid normal.  Lungs:  Clear without wheezing, rales or rhonchi Cardiac: RR, without RMG Abdominal:  Soft, nontender, without masses, guarding, rebound, organomegaly or hernia Breasts:  Examined lying and sitting without masses, retractions, discharge or axillary adenopathy. Pelvic:  Ext, BUS, Vagina With atrophic changes. No significant cystocele. Cuff well supported. No rectocele.  Adnexa without masses or tenderness    Anus and perineum normal   Rectovaginal normal sphincter tone without palpated masses or tenderness.    Assessment/Plan:  71 y.o. [id number] female for breast and pelvic exam.   1. Urinary incontinence consistent with stress urinary incontinence. Check urine analysis today. No significant cystocele. Discussed options with the patient to include observation, behavior modification such as frequent voiding, Kegel exercises, referral for his therapy at [facility], surgery. I reviewed Kegel exercises with her. She's going to start  with this first. Will call if she wants referral either for physical therapy or surgical consultation. 2. Postmenopausal/HRT. Continues on estradiol 0.5 mg. Obtains prescription through Dr. Dennard Schaumann. I reviewed the most current NAMS 2017 guidelines and discussed the risks to include thrombosis such as stroke heart attack DVT possible breast cancer. She understands accepts and wants to continue. She also uses estradiol vaginal cream twice weekly which helps with her vaginal dryness/dyspareunia. She relates that it is very expensive. [facility] vaginal [facility]. Refill 1 year provided for twice weekly application. 3. Osteopenia. DEXA 06/2015 T score -2.0. Dr. Dennard Schaumann is following her for this. Increased calcium vitamin D reviewed. 4. Pap smear 2012. No Pap smear done today. No history of abnormal Pap smears. Per current screening guidelines based on age and hysterectomy history we both agreed to stop screening. 5. Mammography 11/2015. Continue with annual mammography when due. SBE monthly reviewed. 6. Colonoscopy 2012. Repeat at their recommended interval. 7. Health maintenance. No routine lab work done as this is done elsewhere. Follow up 1 year, sooner as needed.  Additional time in excess of her breast and pelvic exam was spent in direct face to face counseling and coordination of care in regards to her urinary incontinence.    Anastasio Auerbach MD, 9:06 AM 07/11/2016

## 2016-08-12 ENCOUNTER — Telehealth: Payer: Self-pay | Admitting: Family Medicine

## 2016-08-12 NOTE — Telephone Encounter (Signed)
ok 

## 2016-08-12 NOTE — Telephone Encounter (Signed)
Requesting a refill on her Xanax - Ok to refill??

## 2016-08-13 MED ORDER — ALPRAZOLAM 0.25 MG PO TABS: 0.2500 mg | ORAL_TABLET | Freq: Three times a day (TID) | ORAL | 2 refills | Status: DC | PRN

## 2016-08-13 NOTE — Telephone Encounter (Signed)
Medication called/sent to requested pharmacy  

## 2016-09-18 ENCOUNTER — Other Ambulatory Visit: Payer: Self-pay | Admitting: Family Medicine

## 2016-10-15 DIAGNOSIS — K582 Mixed irritable bowel syndrome: Secondary | ICD-10-CM | POA: Diagnosis not present

## 2016-10-15 DIAGNOSIS — K5900 Constipation, unspecified: Secondary | ICD-10-CM | POA: Diagnosis not present

## 2016-10-15 DIAGNOSIS — K219 Gastro-esophageal reflux disease without esophagitis: Secondary | ICD-10-CM | POA: Diagnosis not present

## 2016-10-18 ENCOUNTER — Other Ambulatory Visit: Payer: Self-pay | Admitting: Family Medicine

## 2016-11-18 ENCOUNTER — Other Ambulatory Visit: Payer: Self-pay | Admitting: Family Medicine

## 2016-11-21 ENCOUNTER — Telehealth: Payer: Self-pay | Admitting: *Deleted

## 2016-11-21 NOTE — Telephone Encounter (Signed)
Received fax requesting refill on xanax.   Ok to refill?

## 2016-11-22 MED ORDER — ALPRAZOLAM 0.25 MG PO TABS: 0.2500 mg | ORAL_TABLET | Freq: Three times a day (TID) | ORAL | 2 refills | Status: DC | PRN

## 2016-11-22 NOTE — Telephone Encounter (Signed)
ok 

## 2016-11-22 NOTE — Telephone Encounter (Signed)
Medication called to pharmacy. 

## 2016-11-23 ENCOUNTER — Encounter: Payer: Self-pay | Admitting: Family Medicine

## 2016-11-23 DIAGNOSIS — Z1231 Encounter for screening mammogram for malignant neoplasm of breast: Secondary | ICD-10-CM | POA: Diagnosis not present

## 2016-11-27 ENCOUNTER — Encounter: Payer: Self-pay | Admitting: Family Medicine

## 2016-11-27 ENCOUNTER — Encounter: Payer: Self-pay | Admitting: Gynecology

## 2016-11-27 DIAGNOSIS — N6310 Unspecified lump in the right breast, unspecified quadrant: Secondary | ICD-10-CM | POA: Diagnosis not present

## 2016-11-27 DIAGNOSIS — N6001 Solitary cyst of right breast: Secondary | ICD-10-CM | POA: Diagnosis not present

## 2017-02-18 ENCOUNTER — Telehealth: Payer: Self-pay | Admitting: *Deleted

## 2017-02-18 NOTE — Telephone Encounter (Signed)
ok 

## 2017-02-18 NOTE — Telephone Encounter (Signed)
Received fax requesting refill on Xanax.   Ok to refill?

## 2017-02-19 MED ORDER — ALPRAZOLAM 0.25 MG PO TABS: 0.2500 mg | ORAL_TABLET | Freq: Three times a day (TID) | ORAL | 2 refills | Status: DC | PRN

## 2017-02-19 NOTE — Telephone Encounter (Signed)
Medication called to pharmacy. 

## 2017-03-14 DIAGNOSIS — Z23 Encounter for immunization: Secondary | ICD-10-CM | POA: Diagnosis not present

## 2017-05-12 ENCOUNTER — Encounter: Payer: Medicare Other | Admitting: Family Medicine

## 2017-05-15 ENCOUNTER — Other Ambulatory Visit: Payer: Self-pay

## 2017-05-21 ENCOUNTER — Other Ambulatory Visit: Payer: Self-pay | Admitting: Family Medicine

## 2017-05-21 NOTE — Telephone Encounter (Signed)
Medication refilled per protocol. 

## 2017-05-22 ENCOUNTER — Telehealth: Payer: Self-pay | Admitting: Family Medicine

## 2017-05-22 NOTE — Telephone Encounter (Signed)
Pharm requesting refill on Xanax - Ok to refill??

## 2017-05-22 NOTE — Telephone Encounter (Signed)
ok 

## 2017-05-23 MED ORDER — ALPRAZOLAM 0.25 MG PO TABS: 0.2500 mg | ORAL_TABLET | Freq: Three times a day (TID) | ORAL | 2 refills | Status: DC | PRN

## 2017-05-23 NOTE — Telephone Encounter (Signed)
Medication called/sent to requested pharmacy  

## 2017-06-12 ENCOUNTER — Encounter: Payer: Medicare Other | Admitting: Family Medicine

## 2017-07-14 ENCOUNTER — Encounter: Payer: Self-pay | Admitting: Gynecology

## 2017-07-14 ENCOUNTER — Ambulatory Visit (INDEPENDENT_AMBULATORY_CARE_PROVIDER_SITE_OTHER): Payer: Medicare Other | Admitting: Gynecology

## 2017-07-14 VITALS — BP 122/78 | Ht 61.0 in | Wt 114.0 lb

## 2017-07-14 DIAGNOSIS — Z9189 Other specified personal risk factors, not elsewhere classified: Secondary | ICD-10-CM

## 2017-07-14 DIAGNOSIS — Z79890 Hormone replacement therapy (postmenopausal): Secondary | ICD-10-CM | POA: Diagnosis not present

## 2017-07-14 DIAGNOSIS — Z01411 Encounter for gynecological examination (general) (routine) with abnormal findings: Secondary | ICD-10-CM | POA: Diagnosis not present

## 2017-07-14 DIAGNOSIS — M8588 Other specified disorders of bone density and structure, other site: Secondary | ICD-10-CM

## 2017-07-14 DIAGNOSIS — M8580 Other specified disorders of bone density and structure, unspecified site: Secondary | ICD-10-CM

## 2017-07-14 DIAGNOSIS — N898 Other specified noninflammatory disorders of vagina: Secondary | ICD-10-CM | POA: Diagnosis not present

## 2017-07-14 DIAGNOSIS — N952 Postmenopausal atrophic vaginitis: Secondary | ICD-10-CM | POA: Diagnosis not present

## 2017-07-14 DIAGNOSIS — Z113 Encounter for screening for infections with a predominantly sexual mode of transmission: Secondary | ICD-10-CM | POA: Diagnosis not present

## 2017-07-14 LAB — WET PREP FOR TRICH, YEAST, CLUE

## 2017-07-14 MED ORDER — FLUCONAZOLE 150 MG PO TABS: 150.0000 mg | ORAL_TABLET | Freq: Once | ORAL | 0 refills | Status: AC

## 2017-07-14 MED ORDER — ESTRADIOL 0.5 MG PO TABS: 0.5000 mg | ORAL_TABLET | Freq: Every day | ORAL | 12 refills | Status: DC

## 2017-07-14 NOTE — Progress Notes (Signed)
Melissa Owen [birth year] [id number]        71 y.o.  [id number] for breast and pelvic exam.  Patient also notes a vaginal discharge with slight irritation.  Has been present for several weeks.  No odor.  No urinary symptoms such as frequency dysuria urgency low back pain fever or chills.  No nausea vomiting diarrhea constipation.  Recently found out that her longtime boyfriend was involved with another woman that included drug use.  Other than the vaginal discharge she has noticed no lesions, ulcers or any other abnormality.  Past medical history,surgical history, problem list, medications, allergies, family history and social history were all reviewed and documented as reviewed in the EPIC chart.  ROS:  Performed with pertinent positives and negatives included in the history, assessment and plan.   Additional significant findings : None   Exam: Caryn Bee assistant Vitals:   07/14/17 0826  BP: 122/78  Weight: 114 lb (51.7 kg)  Height: 5\' 1"  (1.549 m)   Body mass index is 21.54 kg/m.  General appearance:  Normal affect, orientation and appearance. Skin: Grossly normal HEENT: Without gross lesions.  No cervical or supraclavicular adenopathy. Thyroid normal.  Lungs:  Clear without wheezing, rales or rhonchi Cardiac: RR, without RMG Abdominal:  Soft, nontender, without masses, guarding, rebound, organomegaly or hernia Breasts:  Examined lying and sitting without masses, retractions, discharge or axillary adenopathy. Pelvic:  Ext, BUS, Vagina: With atrophic changes.  Wet prep done.  GC/Chlamydia screen of the distal urethra/vaginal vault  Adnexa: Without masses or tenderness    Anus and perineum: Normal   Rectovaginal: Normal sphincter tone without palpated masses or tenderness.    Assessment/Plan:  72 y.o. [id number] female for breast and pelvic exam.   1. Vaginal discharge/possible STD exposure.  Wet prep is negative.  Given the itching symptoms I am going to cover her with  Diflucan 150 mg x1 dose.  She will follow-up if her symptoms continue.  Exam otherwise normal without evidence of HPV changes ulcerations or other STD suggesting lesions.  Reviewed issues of STD screening.  Given the involvement of drugs and this other relationship I recommended serum screening to include HIV RPR hepatitis B and hepatitis C.  We also did a GC/Chlamydia screen of the vagina and urethra.  Patient will follow-up for results. 2. Postmenopausal/HRT.  Continues on estradiol 0.5 mg and estradiol vaginal cream for vaginal dryness twice weekly.  Has tried stopping the oral estradiol and "felt terrible".  I again reviewed the risks versus benefits to include thrombosis such as stroke heart attack DVT in the breast cancer issue.  Patient strongly wants to continue accepting the risks and I refilled both prescriptions times 1 year. 3. Mammography 11/2016.  Continue with annual mammography when due.  Breast exam normal today. 4. Osteopenia.  DEXA 2017 T score -2.0.  Dr. Dennard Schaumann is following her for this and she will follow-up with him in reference to this. 5. Colonoscopy 2012.  Repeat at their recommended interval. 6. Pap smear 2012.  No Pap smear done today.  No history of abnormal Pap smears.  Per current screening guidelines based on age and hysterectomy history we both agree to stop screening. 7. Health maintenance.  No routine lab work done as patient does this elsewhere.  Follow-up 1 year, sooner as needed.  Additional time in excess of her breast and pelvic exam was spent in direct face to face counseling and coordination of care in regards to her vaginal discharge and possible STD  exposure.    Anastasio Auerbach MD, 8:44 AM 07/14/2017

## 2017-07-14 NOTE — Addendum Note (Signed)
Addended by: Nelva Nay on: 07/14/2017 09:25 AM   Modules accepted: Orders

## 2017-07-14 NOTE — Patient Instructions (Signed)
Follow up for annual exam in one year 

## 2017-07-15 ENCOUNTER — Telehealth: Payer: Self-pay | Admitting: *Deleted

## 2017-07-15 LAB — C. TRACHOMATIS/N. GONORRHOEAE RNA
C. trachomatis RNA, TMA: NOT DETECTED
N. gonorrhoeae RNA, TMA: NOT DETECTED

## 2017-07-15 LAB — RPR: RPR Ser Ql: NONREACTIVE

## 2017-07-15 LAB — HIV ANTIBODY (ROUTINE TESTING W REFLEX): HIV 1&2 Ab, 4th Generation: NONREACTIVE

## 2017-07-15 LAB — HEPATITIS C ANTIBODY
Hepatitis C Ab: NONREACTIVE
SIGNAL TO CUT-OFF: 0.01 (ref <1.00)

## 2017-07-15 LAB — HEPATITIS B SURFACE ANTIGEN: Hepatitis B Surface Ag: NONREACTIVE

## 2017-07-15 MED ORDER — NONFORMULARY OR COMPOUNDED ITEM: 3 refills | Status: DC

## 2017-07-15 NOTE — Telephone Encounter (Signed)
-----   Message from Anastasio Auerbach, MD sent at 07/14/2017  8:55 AM EST ----- Refill the patient's vaginal estradiol cream at [place] times 1 year

## 2017-07-15 NOTE — Telephone Encounter (Signed)
Rx called in 

## 2017-07-17 ENCOUNTER — Ambulatory Visit (INDEPENDENT_AMBULATORY_CARE_PROVIDER_SITE_OTHER): Payer: Medicare Other | Admitting: Family Medicine

## 2017-07-17 ENCOUNTER — Encounter: Payer: Self-pay | Admitting: Family Medicine

## 2017-07-17 VITALS — BP 120/72 | HR 88 | Temp 98.3°F | Resp 15 | Ht 63.0 in | Wt 112.0 lb

## 2017-07-17 DIAGNOSIS — Z13220 Encounter for screening for lipoid disorders: Secondary | ICD-10-CM

## 2017-07-17 DIAGNOSIS — M8580 Other specified disorders of bone density and structure, unspecified site: Secondary | ICD-10-CM | POA: Diagnosis not present

## 2017-07-17 DIAGNOSIS — E559 Vitamin D deficiency, unspecified: Secondary | ICD-10-CM

## 2017-07-17 DIAGNOSIS — Z0000 Encounter for general adult medical examination without abnormal findings: Secondary | ICD-10-CM | POA: Diagnosis not present

## 2017-07-17 NOTE — Progress Notes (Signed)
Subjective:    Patient ID: Melissa Owen, female    DOB: 04/02/1946, 72 y.o.   MRN: [id number]  HPI Subjective:   Patient presents for Medicare Annual/Subsequent preventive examination.  Patient recently saw her gynecologist, Dr. Phineas Real, who performed her pelvic exam.  He is prescribing her estrogen.  Both he and I have recommended that she try to wean away from estrogen which she takes for vaginal dryness.  Today I discussed the risk of stroke, breast cancer, and DVT.  I would like her to try gradually weaning away from hormone replacement therapy due to this risk.  Her last bone density test was in 2017 and showed osteopenia.  Last mammogram was in June 2018.  Colonoscopy is due again in 2021.  Immunizations are up-to-date and listed below: Immunization History  Administered Date(s) Administered  . Influenza Whole 03/10/2009  . Influenza, High Dose Seasonal PF 02/25/2016  . Influenza,inj,Quad PF,6+ Mos 02/16/2013, 03/14/2014, 05/02/2015  . Influenza-Unspecified 03/10/2017  . Pneumococcal Conjugate-13 03/14/2014  . Pneumococcal Polysaccharide-23 03/30/2009  . Tdap 12/19/2008  . Zoster 12/19/2008  . Zoster Recombinat (Shingrix) 10/08/2016, 12/08/2016     Review Past Medical/Family/Social: Past Medical History:  Diagnosis Date  . Anxiety   . IBS (irritable bowel syndrome)   . Mitral valve prolapse   . Osteopenia 06/2015   T score - 2.0  . Urinary incontinence    Past Surgical History:  Procedure Laterality Date  . ABDOMINAL HYSTERECTOMY  age 24   Leiomyomata  . carpal tunnel repair    . TUBAL LIGATION     Current Outpatient Medications on File Prior to Visit  Medication Sig Dispense Refill  . ALPRAZolam (XANAX) 0.25 MG tablet Take 1 tablet (0.25 mg total) by mouth 3 (three) times daily as needed. 90 tablet 2  . Cholecalciferol (VITAMIN D PO) Take by mouth.    . estradiol (ESTRACE) 0.5 MG tablet Take 1 tablet (0.5 mg total) by mouth daily. 30 tablet 12  . famotidine  (PEPCID) 20 MG tablet Take 20 mg by mouth 2 (two) times daily.    . NONFORMULARY OR COMPOUNDED ITEM Estradiol vaginal cream 0.02% insert twice weekly vaginally 90 each 3  . Probiotic Product (PROBIOTIC PO) Take by mouth.    . Wheat Dextrin (BENEFIBER PO) Take by mouth.     No current facility-administered medications on file prior to visit.    Allergies  Allergen Reactions  . Aspirin Nausea And Vomiting  . Latex Itching   Social History   Socioeconomic History  . Marital status: Divorced    Spouse name: Not on file  . Number of children: Not on file  . Years of education: Not on file  . Highest education level: Not on file  Social Needs  . Financial resource strain: Not on file  . Food insecurity - worry: Not on file  . Food insecurity - inability: Not on file  . Transportation needs - medical: Not on file  . Transportation needs - non-medical: Not on file  Occupational History  . Not on file  Tobacco Use  . Smoking status: Never Smoker  . Smokeless tobacco: Never Used  Substance and Sexual Activity  . Alcohol use: Yes    Alcohol/week: 0.0 oz    Comment: Occasional  . Drug use: No  . Sexual activity: Yes    Comment: 1st intercourse 72 yo-Fewer than 5 partners  Other Topics Concern  . Not on file  Social History Narrative  . Not on file  Family History  Problem Relation Age of Onset  . Dementia Mother   . Heart disease Mother 80  . Heart disease Father   . Cancer Maternal Grandmother        colon cancer    Depression Screen  (Note: if answer to either of the following is "Yes", a more complete depression screening is indicated)  Over the past two weeks, have you felt down, depressed or hopeless? No Over the past two weeks, have you felt little interest or pleasure in doing things? No Have you lost interest or pleasure in daily life? No Do you often feel hopeless? No Do you cry easily over simple problems? No   Activities of Daily Living  In your present  state of health, do you have any difficulty performing the following activities?:  Driving? No  Managing money? No  Feeding yourself? No  Getting from bed to chair? No  Climbing a flight of stairs? No  Preparing food and eating?: No  Bathing or showering? No  Getting dressed: No  Getting to the toilet? No  Using the toilet:No  Moving around from place to place: No  In the past year have you fallen or had a near fall?:No  Are you sexually active? No  Do you have more than one partner? No   Hearing Difficulties: No  Do you often ask people to speak up or repeat themselves? No  Do you experience ringing or noises in your ears? No Do you have difficulty understanding soft or whispered voices? No  Do you feel that you have a problem with memory? No Do you often misplace items? No  Do you feel safe at home? Yes  Cognitive Testing  Alert? Yes Normal Appearance?Yes  Oriented to person? Yes Place? Yes  Time? Yes  Recall of three objects? Yes  Can perform simple calculations? Yes  Displays appropriate judgment?Yes  Can read the correct time from a watch face?Yes   Screening Tests / Date Colonoscopy    2011               Zostavax 2010 Mammogram 6/17 Influenza Vaccine-UTD Tetanus/tdap 2010  Review of Systems  All other systems reviewed and are negative.      Objective:   Physical Exam  Constitutional: She is oriented to person, place, and time. She appears well-developed and well-nourished. No distress.  HENT:  Head: Normocephalic and atraumatic.  Right Ear: External ear normal.  Left Ear: External ear normal.  Nose: Nose normal.  Mouth/Throat: Oropharynx is clear and moist. No oropharyngeal exudate.  Eyes: Conjunctivae and EOM are normal. Pupils are equal, round, and reactive to light. Right eye exhibits no discharge. Left eye exhibits no discharge. No scleral icterus.  Neck: Normal range of motion. Neck supple. No JVD present. No tracheal deviation present. No thyromegaly  present.  Cardiovascular: Normal rate, regular rhythm, normal heart sounds and intact distal pulses. Exam reveals no gallop and no friction rub.  No murmur heard. Pulmonary/Chest: Effort normal and breath sounds normal. No stridor. No respiratory distress. She has no wheezes. She has no rales. She exhibits no tenderness.  Abdominal: Soft. Bowel sounds are normal. She exhibits no distension and no mass. There is no tenderness. There is no rebound and no guarding.  Musculoskeletal: Normal range of motion. She exhibits no edema or tenderness.  Lymphadenopathy:    She has no cervical adenopathy.  Neurological: She is alert and oriented to person, place, and time. She has normal reflexes. No cranial  nerve deficit. She exhibits normal muscle tone. Coordination normal.  Skin: Skin is warm. No rash noted. She is not diaphoretic. No erythema. No pallor.  Psychiatric: She has a normal mood and affect. Her behavior is normal. Judgment and thought content normal.  Vitals reviewed.         Assessment & Plan:  Screening cholesterol level - Plan: CBC with Differential/Platelet, COMPLETE METABOLIC PANEL WITH GFR, Lipid panel  Routine general medical examination at a health care facility  Vitamin D deficiency - Plan: VITAMIN D 25 Hydroxy (Vit-D Deficiency, Fractures)  Osteopenia, unspecified location  patient's physical exam is completely normal. Her mammogram and colonoscopy are up-to-date. The patient's immunizations are up-to-date.  Given her age and her history of a hysterectomy, she no longer requires Pap smear. She is taking calcium and vitamin D for osteopenia. We will repeat a bone density in 2020. Otherwise no changes in therapy at this time Medicare Attestation  I have personally reviewed:  The patient's medical and social history  Their use of alcohol, tobacco or illicit drugs  Their current medications and supplements  The patient's functional ability including ADLs,fall risks, home safety  risks, cognitive, and hearing and visual impairment  Diet and physical activities  Evidence for depression or mood disorders  The patient's weight, height, BMI, and visual acuity have been recorded in the chart. I have made referrals, counseling, and provided education to the patient based on review of the above and I have provided the patient with a written personalized care plan for preventive services.

## 2017-07-18 LAB — CBC WITH DIFFERENTIAL/PLATELET
BASOS PCT: 0.7 %
Basophils Absolute: 39 cells/uL (ref 0–200)
EOS PCT: 1.5 %
Eosinophils Absolute: 83 cells/uL (ref 15–500)
HEMATOCRIT: 39.7 % (ref 35.0–45.0)
Hemoglobin: 13.3 g/dL (ref 11.7–15.5)
LYMPHS ABS: 1865 {cells}/uL (ref 850–3900)
MCH: 30.5 pg (ref 27.0–33.0)
MCHC: 33.5 g/dL (ref 32.0–36.0)
MCV: 91.1 fL (ref 80.0–100.0)
MPV: 11.2 fL (ref 7.5–12.5)
Monocytes Relative: 6.1 %
NEUTROS ABS: 3179 {cells}/uL (ref 1500–7800)
Neutrophils Relative %: 57.8 %
Platelets: 297 10*3/uL (ref 140–400)
RBC: 4.36 10*6/uL (ref 3.80–5.10)
RDW: 11.9 % (ref 11.0–15.0)
Total Lymphocyte: 33.9 %
WBC: 5.5 10*3/uL (ref 3.8–10.8)
WBCMIX: 336 {cells}/uL (ref 200–950)

## 2017-07-18 LAB — LIPID PANEL
CHOL/HDL RATIO: 2.2 (calc) (ref <5.0)
Cholesterol: 238 mg/dL — ABNORMAL HIGH (ref <200)
HDL: 108 mg/dL (ref >50)
LDL Cholesterol (Calc): 113 mg/dL (calc) — ABNORMAL HIGH
NON-HDL CHOLESTEROL (CALC): 130 mg/dL — AB (ref <130)
TRIGLYCERIDES: 76 mg/dL (ref <150)

## 2017-07-18 LAB — COMPLETE METABOLIC PANEL WITH GFR
AG RATIO: 1.5 (calc) (ref 1.0–2.5)
ALT: 10 U/L (ref 6–29)
AST: 13 U/L (ref 10–35)
Albumin: 4.3 g/dL (ref 3.6–5.1)
Alkaline phosphatase (APISO): 69 U/L (ref 33–130)
BUN: 11 mg/dL (ref 7–25)
CALCIUM: 9.8 mg/dL (ref 8.6–10.4)
CO2: 28 mmol/L (ref 20–32)
CREATININE: 0.67 mg/dL (ref 0.60–0.93)
Chloride: 104 mmol/L (ref 98–110)
GFR, EST AFRICAN AMERICAN: 102 mL/min/{1.73_m2} (ref >=60)
GFR, EST NON AFRICAN AMERICAN: 88 mL/min/{1.73_m2} (ref >=60)
GLOBULIN: 2.8 g/dL (ref 1.9–3.7)
Glucose, Bld: 87 mg/dL (ref 65–99)
POTASSIUM: 4.9 mmol/L (ref 3.5–5.3)
SODIUM: 142 mmol/L (ref 135–146)
TOTAL PROTEIN: 7.1 g/dL (ref 6.1–8.1)
Total Bilirubin: 0.4 mg/dL (ref 0.2–1.2)

## 2017-07-18 LAB — VITAMIN D 25 HYDROXY (VIT D DEFICIENCY, FRACTURES): VIT D 25 HYDROXY: 13 ng/mL — AB (ref 30–100)

## 2017-07-23 ENCOUNTER — Other Ambulatory Visit: Payer: Self-pay | Admitting: Family Medicine

## 2017-07-23 DIAGNOSIS — E559 Vitamin D deficiency, unspecified: Secondary | ICD-10-CM

## 2017-07-23 DIAGNOSIS — I341 Nonrheumatic mitral (valve) prolapse: Secondary | ICD-10-CM

## 2017-07-23 MED ORDER — VITAMIN D (ERGOCALCIFEROL) 1.25 MG (50000 UNIT) PO CAPS: 50000.0000 [IU] | ORAL_CAPSULE | ORAL | 1 refills | Status: DC

## 2017-08-25 ENCOUNTER — Other Ambulatory Visit: Payer: Self-pay | Admitting: Family Medicine

## 2017-08-25 MED ORDER — ALPRAZOLAM 0.25 MG PO TABS: 0.2500 mg | ORAL_TABLET | Freq: Three times a day (TID) | ORAL | 2 refills | Status: DC | PRN

## 2017-08-25 NOTE — Telephone Encounter (Signed)
Pt is requesting refill on Xanax   LOV: 07/17/17  LRF:   05/23/17

## 2017-10-13 DIAGNOSIS — K582 Mixed irritable bowel syndrome: Secondary | ICD-10-CM | POA: Diagnosis not present

## 2017-10-13 DIAGNOSIS — K219 Gastro-esophageal reflux disease without esophagitis: Secondary | ICD-10-CM | POA: Diagnosis not present

## 2017-10-17 ENCOUNTER — Telehealth: Payer: Self-pay | Admitting: Family Medicine

## 2017-10-17 NOTE — Telephone Encounter (Signed)
Patient called [facility] stating that she needs to have dental work done and needs to know if she needs an antibx prior to procedure since she has MVP?

## 2017-10-20 NOTE — Telephone Encounter (Signed)
No abx are no longer recommended for Mitral valve prolapse.

## 2017-10-20 NOTE — Telephone Encounter (Signed)
Pt aware.

## 2017-11-22 ENCOUNTER — Other Ambulatory Visit: Payer: Self-pay | Admitting: Family Medicine

## 2017-11-24 NOTE — Telephone Encounter (Signed)
Ok to refill??  Last office visit 07/17/2017.  Last refill 08/25/2017, #2 refills.

## 2017-11-28 DIAGNOSIS — B359 Dermatophytosis, unspecified: Secondary | ICD-10-CM | POA: Diagnosis not present

## 2017-11-28 DIAGNOSIS — L03019 Cellulitis of unspecified finger: Secondary | ICD-10-CM | POA: Diagnosis not present

## 2017-11-29 DIAGNOSIS — Z1231 Encounter for screening mammogram for malignant neoplasm of breast: Secondary | ICD-10-CM | POA: Diagnosis not present

## 2017-11-29 LAB — HM MAMMOGRAPHY

## 2017-12-01 ENCOUNTER — Encounter: Payer: Self-pay | Admitting: Gynecology

## 2017-12-02 DIAGNOSIS — R922 Inconclusive mammogram: Secondary | ICD-10-CM | POA: Diagnosis not present

## 2017-12-02 LAB — HM MAMMOGRAPHY

## 2017-12-10 ENCOUNTER — Encounter: Payer: Self-pay | Admitting: *Deleted

## 2017-12-23 ENCOUNTER — Other Ambulatory Visit: Payer: Self-pay | Admitting: Family Medicine

## 2017-12-23 NOTE — Telephone Encounter (Signed)
Ok to refill??  Last office visit 07/17/2017.  Last refill 11/24/2017.

## 2018-01-06 DIAGNOSIS — H2513 Age-related nuclear cataract, bilateral: Secondary | ICD-10-CM | POA: Diagnosis not present

## 2018-01-13 ENCOUNTER — Other Ambulatory Visit: Payer: Self-pay | Admitting: Family Medicine

## 2018-01-13 DIAGNOSIS — E559 Vitamin D deficiency, unspecified: Secondary | ICD-10-CM

## 2018-01-21 ENCOUNTER — Other Ambulatory Visit: Payer: Medicare Other

## 2018-01-21 DIAGNOSIS — E559 Vitamin D deficiency, unspecified: Secondary | ICD-10-CM

## 2018-01-22 ENCOUNTER — Encounter: Payer: Self-pay | Admitting: *Deleted

## 2018-01-22 ENCOUNTER — Other Ambulatory Visit: Payer: Self-pay | Admitting: Family Medicine

## 2018-01-22 LAB — VITAMIN D 25 HYDROXY (VIT D DEFICIENCY, FRACTURES): VIT D 25 HYDROXY: 51 ng/mL (ref 30–100)

## 2018-01-23 NOTE — Telephone Encounter (Signed)
Ok to refill??  Last office visit 07/17/2017.   Last refill 12/25/2017.

## 2018-02-16 DIAGNOSIS — K582 Mixed irritable bowel syndrome: Secondary | ICD-10-CM | POA: Diagnosis not present

## 2018-02-24 ENCOUNTER — Other Ambulatory Visit: Payer: Self-pay | Admitting: Family Medicine

## 2018-02-24 NOTE — Telephone Encounter (Signed)
Ok to refill??  Last office visit 07/17/2017.  Last refill 01/23/2018.

## 2018-03-12 DIAGNOSIS — Z23 Encounter for immunization: Secondary | ICD-10-CM | POA: Diagnosis not present

## 2018-03-26 ENCOUNTER — Other Ambulatory Visit: Payer: Self-pay | Admitting: Family Medicine

## 2018-03-26 NOTE — Telephone Encounter (Signed)
Pt is requesting refill on Xanax   LOV: 07/17/17  LRF:   02/24/18

## 2018-04-20 DIAGNOSIS — D225 Melanocytic nevi of trunk: Secondary | ICD-10-CM | POA: Diagnosis not present

## 2018-04-20 DIAGNOSIS — L821 Other seborrheic keratosis: Secondary | ICD-10-CM | POA: Diagnosis not present

## 2018-04-20 DIAGNOSIS — L820 Inflamed seborrheic keratosis: Secondary | ICD-10-CM | POA: Diagnosis not present

## 2018-04-20 DIAGNOSIS — L570 Actinic keratosis: Secondary | ICD-10-CM | POA: Diagnosis not present

## 2018-04-20 DIAGNOSIS — L814 Other melanin hyperpigmentation: Secondary | ICD-10-CM | POA: Diagnosis not present

## 2018-04-20 DIAGNOSIS — D1801 Hemangioma of skin and subcutaneous tissue: Secondary | ICD-10-CM | POA: Diagnosis not present

## 2018-04-27 ENCOUNTER — Other Ambulatory Visit: Payer: Self-pay | Admitting: Family Medicine

## 2018-04-27 NOTE — Telephone Encounter (Signed)
Ok to refill??  Last office visit 07/17/2017.  Last refill 03/26/2018.

## 2018-05-26 ENCOUNTER — Other Ambulatory Visit: Payer: Self-pay | Admitting: Family Medicine

## 2018-05-26 NOTE — Telephone Encounter (Signed)
Ok to refill??  Last office visit 07/17/2017.  Last refill 04/27/2018.

## 2018-06-26 ENCOUNTER — Other Ambulatory Visit: Payer: Self-pay | Admitting: Family Medicine

## 2018-06-26 NOTE — Telephone Encounter (Signed)
Ok to refill??  Last office visit 07/17/2017.  Last refill 05/26/2018.

## 2018-07-17 ENCOUNTER — Ambulatory Visit (INDEPENDENT_AMBULATORY_CARE_PROVIDER_SITE_OTHER): Payer: Medicare Other | Admitting: Gynecology

## 2018-07-17 ENCOUNTER — Encounter: Payer: Self-pay | Admitting: Gynecology

## 2018-07-17 VITALS — BP 110/78 | Ht 63.0 in | Wt 118.0 lb

## 2018-07-17 DIAGNOSIS — Z01419 Encounter for gynecological examination (general) (routine) without abnormal findings: Secondary | ICD-10-CM | POA: Diagnosis not present

## 2018-07-17 DIAGNOSIS — Z79890 Hormone replacement therapy (postmenopausal): Secondary | ICD-10-CM

## 2018-07-17 DIAGNOSIS — M8580 Other specified disorders of bone density and structure, unspecified site: Secondary | ICD-10-CM

## 2018-07-17 DIAGNOSIS — N952 Postmenopausal atrophic vaginitis: Secondary | ICD-10-CM

## 2018-07-17 MED ORDER — ESTRADIOL 0.5 MG PO TABS: 0.5000 mg | ORAL_TABLET | Freq: Every day | ORAL | 4 refills | Status: DC

## 2018-07-17 NOTE — Patient Instructions (Signed)
Follow-up in 1 year for annual exam, sooner if any issues. 

## 2018-07-17 NOTE — Progress Notes (Signed)
    Melissa Owen 1946-03-06 [id number]        72 y.o.  [id number] for breast and pelvic exam.  Without gynecologic complaints.  Several issues noted below.  Past medical history,surgical history, problem list, medications, allergies, family history and social history were all reviewed and documented as reviewed in the EPIC chart.  ROS:  Performed with pertinent positives and negatives included in the history, assessment and plan.   Additional significant findings : None   Exam: Wandra Scot assistant Vitals:   07/17/18 0821  BP: 110/78  Weight: 118 lb (53.5 kg)  Height: 5\' 3"  (1.6 m)   Body mass index is 20.9 kg/m.  General appearance:  Normal affect, orientation and appearance. Skin: Grossly normal HEENT: Without gross lesions.  No cervical or supraclavicular adenopathy. Thyroid normal.  Lungs:  Clear without wheezing, rales or rhonchi Cardiac: RR, without RMG Abdominal:  Soft, nontender, without masses, guarding, rebound, organomegaly or hernia Breasts:  Examined lying and sitting without masses, retractions, discharge or axillary adenopathy. Pelvic:  Ext, BUS, Vagina: With atrophic changes  Adnexa: Without masses or tenderness    Anus and perineum: Normal   Rectovaginal: Normal sphincter tone without palpated masses or tenderness.    Assessment/Plan:  73 y.o. [id number] female for breast and pelvic exam.  Status post TAH for leiomyoma.  1. Postmenopausal/ERT.  Continues on estradiol 0.5 mg daily.  Has tried weaning but has unacceptable hot flushes and sweats.  We again discussed the risks particularly moving into her 12s of increased risk of stroke heart attack DVT in the breast cancer issue.  At this point the patient wants to continue understanding and accepting the risks.  She feels it is a quality of life issue.  Refill x1 year provided.  Also uses estradiol vaginal cream twice weekly through [facility].  Feels that it helps with her bladder as far as incontinence.   Has supply but will call when needs more. 2. Mammography coming due in June and I reminded her to schedule this.  Breast exam normal today. 3. Osteopenia.  DEXA 2017 T score -2.0.  Patient reports that Dr. Dennard Schaumann is going to schedule this year and she will follow-up with him. 4. Colonoscopy 2012.  Patient reports Dr. Oletta Lamas is planning to repeat this next year. 5. Pap smear 2012.  No Pap smear done today.  No history of abnormal Pap smears.  Per current screening guidelines we both agree to stop screening. 6. Health maintenance.  No routine lab work done as patient does this elsewhere.  Follow-up 1 year, sooner as needed.   Anastasio Auerbach MD, 8:46 AM 07/17/2018

## 2018-07-21 ENCOUNTER — Ambulatory Visit (INDEPENDENT_AMBULATORY_CARE_PROVIDER_SITE_OTHER): Payer: Medicare Other | Admitting: Family Medicine

## 2018-07-21 ENCOUNTER — Encounter: Payer: Self-pay | Admitting: Family Medicine

## 2018-07-21 ENCOUNTER — Other Ambulatory Visit: Payer: Self-pay | Admitting: Family Medicine

## 2018-07-21 VITALS — BP 144/80 | HR 72 | Temp 98.3°F | Resp 14 | Ht 63.0 in | Wt 119.0 lb

## 2018-07-21 DIAGNOSIS — E559 Vitamin D deficiency, unspecified: Secondary | ICD-10-CM

## 2018-07-21 DIAGNOSIS — M8580 Other specified disorders of bone density and structure, unspecified site: Secondary | ICD-10-CM

## 2018-07-21 DIAGNOSIS — R799 Abnormal finding of blood chemistry, unspecified: Secondary | ICD-10-CM | POA: Diagnosis not present

## 2018-07-21 DIAGNOSIS — R0989 Other specified symptoms and signs involving the circulatory and respiratory systems: Secondary | ICD-10-CM | POA: Diagnosis not present

## 2018-07-21 DIAGNOSIS — F419 Anxiety disorder, unspecified: Secondary | ICD-10-CM | POA: Diagnosis not present

## 2018-07-21 DIAGNOSIS — Z13220 Encounter for screening for lipoid disorders: Secondary | ICD-10-CM

## 2018-07-21 DIAGNOSIS — Z0000 Encounter for general adult medical examination without abnormal findings: Secondary | ICD-10-CM | POA: Diagnosis not present

## 2018-07-21 MED ORDER — MELOXICAM 15 MG PO TABS: 15.0000 mg | ORAL_TABLET | Freq: Every day | ORAL | 0 refills | Status: DC

## 2018-07-21 NOTE — Progress Notes (Signed)
Subjective:    Patient ID: Melissa Owen, female    DOB: [birth year], 73 y.o.   MRN: [id number]  HPI Subjective:   Patient presents for Medicare Annual/Subsequent preventive examination.   Last mammogram 6/19- normal Last DEXA showed osteopenia in 2017 with T score of -2.0 Does not require pap due to hysterectomy Last colonoscopy was 2011 with Dr. Oletta Lamas.  She states that she is due in 2021 Immunization History  Administered Date(s) Administered  . Influenza Whole 03/10/2009  . Influenza, High Dose Seasonal PF 02/25/2016, 03/12/2018  . Influenza,inj,Quad PF,6+ Mos 02/16/2013, 03/14/2014, 05/02/2015  . Influenza-Unspecified 03/10/2017  . Pneumococcal Conjugate-13 03/14/2014  . Pneumococcal Polysaccharide-23 03/30/2009  . Tdap 12/19/2008  . Zoster 12/19/2008  . Zoster Recombinat (Shingrix) 10/08/2016, 12/08/2016    Immunizations are up-to-date.  However patient reports pain in her lower back.  It developed approximately 2 weeks ago.  With standing the pain radiates into her posterior right hip, down her right leg, all the way into her right foot.  Symptoms are consistent with right-sided sciatica.  She denies any bowel or bladder incontinence.  She denies any saddle anesthesia.  She denies any leg weakness.  She has not tried any anti-inflammatories.  Pain is made worse by prolonged standing or walking. Review Past Medical/Family/Social: Past Medical History:  Diagnosis Date  . Anxiety   . IBS (irritable bowel syndrome)   . Mitral valve prolapse   . Osteopenia 06/2015   T score - 2.0  . Urinary incontinence    Past Surgical History:  Procedure Laterality Date  . ABDOMINAL HYSTERECTOMY  age 9   Leiomyomata  . carpal tunnel repair    . TUBAL LIGATION     Current Outpatient Medications on File Prior to Visit  Medication Sig Dispense Refill  . ALPRAZolam (XANAX) 0.25 MG tablet TAKE 1 TABLET(0.25 MG) BY MOUTH THREE TIMES DAILY AS NEEDED 90 tablet 1  . Cholecalciferol  (VITAMIN D PO) Take by mouth.    . estradiol (ESTRACE) 0.5 MG tablet Take 1 tablet (0.5 mg total) by mouth daily. 90 tablet 4  . famotidine (PEPCID) 20 MG tablet Take 20 mg by mouth 2 (two) times daily.    Marland Kitchen glycopyrrolate (ROBINUL) 2 MG tablet Take 2 mg by mouth as needed.    . NONFORMULARY OR COMPOUNDED ITEM Estradiol vaginal cream 0.02% insert twice weekly vaginally 90 each 3  . Probiotic Product (PROBIOTIC PO) Take by mouth.    . Wheat Dextrin (BENEFIBER PO) Take by mouth.     No current facility-administered medications on file prior to visit.    Allergies  Allergen Reactions  . Aspirin Nausea And Vomiting  . Latex Itching   Social History   Socioeconomic History  . Marital status: Divorced    Spouse name: Not on file  . Number of children: Not on file  . Years of education: Not on file  . Highest education level: Not on file  Occupational History  . Not on file  Social Needs  . Financial resource strain: Not on file  . Food insecurity:    Worry: Not on file    Inability: Not on file  . Transportation needs:    Medical: Not on file    Non-medical: Not on file  Tobacco Use  . Smoking status: Never Smoker  . Smokeless tobacco: Never Used  Substance and Sexual Activity  . Alcohol use: Yes    Alcohol/week: 0.0 standard drinks    Comment: Occasional  . Drug use:  No  . Sexual activity: Not Currently    Comment: 1st intercourse 73 yo-Fewer than 5 partners  Lifestyle  . Physical activity:    Days per week: Not on file    Minutes per session: Not on file  . Stress: Not on file  Relationships  . Social connections:    Talks on phone: Not on file    Gets together: Not on file    Attends religious service: Not on file    Active member of club or organization: Not on file    Attends meetings of clubs or organizations: Not on file    Relationship status: Not on file  . Intimate partner violence:    Fear of current or ex partner: Not on file    Emotionally abused: Not  on file    Physically abused: Not on file    Forced sexual activity: Not on file  Other Topics Concern  . Not on file  Social History Narrative  . Not on file   Family History  Problem Relation Age of Onset  . Dementia Mother   . Heart disease Mother 70  . Heart disease Father   . Cancer Maternal Grandmother        colon cancer    Review of Systems  All other systems reviewed and are negative.      Objective:   Physical Exam  Constitutional: She is oriented to person, place, and time. She appears well-developed and well-nourished. No distress.  HENT:  Head: Normocephalic and atraumatic.  Right Ear: External ear normal.  Left Ear: External ear normal.  Nose: Nose normal.  Mouth/Throat: Oropharynx is clear and moist. No oropharyngeal exudate.  Eyes: Pupils are equal, round, and reactive to light. Conjunctivae and EOM are normal. Right eye exhibits no discharge. Left eye exhibits no discharge. No scleral icterus.  Neck: Normal range of motion. Neck supple. No JVD present. No tracheal deviation present. No thyromegaly present.  Cardiovascular: Normal rate, regular rhythm, normal heart sounds and intact distal pulses. Exam reveals no gallop and no friction rub.  No murmur heard. Pulmonary/Chest: Effort normal and breath sounds normal. No stridor. No respiratory distress. She has no wheezes. She has no rales. She exhibits no tenderness.  Abdominal: Soft. Bowel sounds are normal. She exhibits no distension and no mass. There is no abdominal tenderness. There is no rebound and no guarding.  Musculoskeletal: Normal range of motion.        General: No tenderness or edema.  Lymphadenopathy:    She has no cervical adenopathy.  Neurological: She is alert and oriented to person, place, and time. She has normal reflexes. No cranial nerve deficit. She exhibits normal muscle tone. Coordination normal.  Skin: Skin is warm. No rash noted. She is not diaphoretic. No erythema. No pallor.    Psychiatric: She has a normal mood and affect. Her behavior is normal. Judgment and thought content normal.  Vitals reviewed.   Physical exam is concerning for right-sided carotid bruit      Assessment & Plan:  Routine general medical examination at a health care facility - Plan: VITAMIN D 25 Hydroxy (Vit-D Deficiency, Fractures), CBC with Differential/Platelet, COMPLETE METABOLIC PANEL WITH GFR, Lipid panel  Osteopenia, unspecified location - Plan: DG Bone Density, VITAMIN D 25 Hydroxy (Vit-D Deficiency, Fractures)  Screening cholesterol level - Plan: CBC with Differential/Platelet, COMPLETE METABOLIC PANEL WITH GFR, Lipid panel  Anxiety  Vitamin D deficiency - Plan: VITAMIN D 25 Hydroxy (Vit-D Deficiency, Fractures)  Right carotid  bruit - Plan: US Carotid Duplex Bilateral  I am concerned by the right-sided bruit.  I have recommended carotid Dopplers to evaluate further.  Patient also endorses symptoms of right-sided sciatica.  We will try treating this with meloxicam 15 mg a day for 2 weeks and then recheck.  If pain persists, I would recommend an MRI of the lumbar spine.  Given her history of osteopenia I would repeat a bone density.  The patient schedules her own mammogram and this is due in June.  Her colonoscopy is not due until next year.  Given her history of vitamin D deficiency I will repeat a vitamin D level.  I will also check a CBC, CMP, and fasting lipid panel.  Regular anticipatory guidance is provided.  Patient denies any problems with falls, depression, or memory loss

## 2018-07-22 LAB — VITAMIN D 25 HYDROXY (VIT D DEFICIENCY, FRACTURES): VIT D 25 HYDROXY: 35 ng/mL (ref 30–100)

## 2018-07-22 LAB — CBC WITH DIFFERENTIAL/PLATELET
Absolute Monocytes: 331 cells/uL (ref 200–950)
Basophils Absolute: 51 cells/uL (ref 0–200)
Basophils Relative: 0.9 %
Eosinophils Absolute: 103 cells/uL (ref 15–500)
Eosinophils Relative: 1.8 %
HEMATOCRIT: 40.9 % (ref 35.0–45.0)
Hemoglobin: 13.4 g/dL (ref 11.7–15.5)
LYMPHS ABS: 1778 {cells}/uL (ref 850–3900)
MCH: 30.3 pg (ref 27.0–33.0)
MCHC: 32.8 g/dL (ref 32.0–36.0)
MCV: 92.5 fL (ref 80.0–100.0)
MPV: 10.9 fL (ref 7.5–12.5)
Monocytes Relative: 5.8 %
Neutro Abs: 3437 cells/uL (ref 1500–7800)
Neutrophils Relative %: 60.3 %
Platelets: 251 10*3/uL (ref 140–400)
RBC: 4.42 10*6/uL (ref 3.80–5.10)
RDW: 12 % (ref 11.0–15.0)
Total Lymphocyte: 31.2 %
WBC: 5.7 10*3/uL (ref 3.8–10.8)

## 2018-07-22 LAB — COMPLETE METABOLIC PANEL WITH GFR
AG Ratio: 1.8 (calc) (ref 1.0–2.5)
ALT: 9 U/L (ref 6–29)
AST: 13 U/L (ref 10–35)
Albumin: 4.3 g/dL (ref 3.6–5.1)
Alkaline phosphatase (APISO): 61 U/L (ref 37–153)
BILIRUBIN TOTAL: 0.5 mg/dL (ref 0.2–1.2)
BUN: 12 mg/dL (ref 7–25)
CO2: 30 mmol/L (ref 20–32)
Calcium: 9.5 mg/dL (ref 8.6–10.4)
Chloride: 104 mmol/L (ref 98–110)
Creat: 0.64 mg/dL (ref 0.60–0.93)
GFR, EST AFRICAN AMERICAN: 103 mL/min/{1.73_m2} (ref >=60)
GFR, Est Non African American: 89 mL/min/{1.73_m2} (ref >=60)
Globulin: 2.4 g/dL (calc) (ref 1.9–3.7)
Glucose, Bld: 85 mg/dL (ref 65–99)
Potassium: 4.6 mmol/L (ref 3.5–5.3)
Sodium: 142 mmol/L (ref 135–146)
Total Protein: 6.7 g/dL (ref 6.1–8.1)

## 2018-07-22 LAB — LIPID PANEL
Cholesterol: 235 mg/dL — ABNORMAL HIGH (ref <200)
HDL: 94 mg/dL (ref >=50)
LDL Cholesterol (Calc): 121 mg/dL (calc) — ABNORMAL HIGH
Non-HDL Cholesterol (Calc): 141 mg/dL (calc) — ABNORMAL HIGH (ref <130)
Total CHOL/HDL Ratio: 2.5 (calc) (ref <5.0)
Triglycerides: 98 mg/dL (ref <150)

## 2018-07-23 ENCOUNTER — Encounter: Payer: Self-pay | Admitting: Family Medicine

## 2018-08-05 ENCOUNTER — Ambulatory Visit
Admission: RE | Admit: 2018-08-05 | Discharge: 2018-08-05 | Disposition: A | Discharge status: Home / Self Care | Payer: Medicare Other | Source: Ambulatory Visit | Attending: Family Medicine | Admitting: Family Medicine

## 2018-08-05 DIAGNOSIS — R0989 Other specified symptoms and signs involving the circulatory and respiratory systems: Secondary | ICD-10-CM

## 2018-08-05 DIAGNOSIS — I6523 Occlusion and stenosis of bilateral carotid arteries: Secondary | ICD-10-CM | POA: Diagnosis not present

## 2018-08-06 ENCOUNTER — Encounter: Payer: Self-pay | Admitting: Family Medicine

## 2018-08-06 ENCOUNTER — Other Ambulatory Visit: Payer: Self-pay | Admitting: Family Medicine

## 2018-08-06 DIAGNOSIS — I6521 Occlusion and stenosis of right carotid artery: Secondary | ICD-10-CM | POA: Insufficient documentation

## 2018-08-06 MED ORDER — ROSUVASTATIN CALCIUM 20 MG PO TABS: 20.0000 mg | ORAL_TABLET | Freq: Every day | ORAL | 3 refills | Status: DC

## 2018-08-12 ENCOUNTER — Other Ambulatory Visit: Payer: Self-pay | Admitting: Gynecology

## 2018-08-24 ENCOUNTER — Other Ambulatory Visit: Payer: Self-pay | Admitting: Family Medicine

## 2018-08-24 NOTE — Telephone Encounter (Signed)
Ok to refill??  Last office visit 07/21/2018.  Last refill 06/26/2018, #1 refills.

## 2018-09-29 ENCOUNTER — Other Ambulatory Visit: Payer: Medicare Other

## 2018-10-25 ENCOUNTER — Other Ambulatory Visit: Payer: Self-pay | Admitting: Family Medicine

## 2018-10-26 NOTE — Telephone Encounter (Signed)
Pt is requesting refill on Xanax   LOV: 07/21/18  LRF:   08/24/18

## 2018-11-13 ENCOUNTER — Other Ambulatory Visit: Payer: Medicare Other

## 2018-11-26 ENCOUNTER — Other Ambulatory Visit: Payer: Self-pay | Admitting: Family Medicine

## 2018-11-26 NOTE — Telephone Encounter (Signed)
Ok to refill??  Last office visit 07/21/2018.  Last refill 10/26/2018.

## 2018-12-22 ENCOUNTER — Other Ambulatory Visit: Payer: Medicare Other

## 2018-12-26 ENCOUNTER — Other Ambulatory Visit: Payer: Self-pay | Admitting: Family Medicine

## 2018-12-28 NOTE — Telephone Encounter (Signed)
Pt is requesting refill on Xanax   LOV: 07/21/18  LRF:  11/26/18

## 2019-01-27 ENCOUNTER — Other Ambulatory Visit: Payer: Self-pay | Admitting: Family Medicine

## 2019-01-27 NOTE — Telephone Encounter (Signed)
Ok to refill??  Last office visit 07/21/2018  Last refill 12/28/2018.

## 2019-01-29 ENCOUNTER — Other Ambulatory Visit: Payer: Self-pay | Admitting: Family Medicine

## 2019-01-30 DIAGNOSIS — Z23 Encounter for immunization: Secondary | ICD-10-CM | POA: Diagnosis not present

## 2019-02-20 ENCOUNTER — Encounter: Payer: Self-pay | Admitting: Gynecology

## 2019-02-20 DIAGNOSIS — Z1231 Encounter for screening mammogram for malignant neoplasm of breast: Secondary | ICD-10-CM | POA: Diagnosis not present

## 2019-02-26 ENCOUNTER — Other Ambulatory Visit: Payer: Medicare Other

## 2019-02-28 ENCOUNTER — Other Ambulatory Visit: Payer: Self-pay | Admitting: Family Medicine

## 2019-03-01 NOTE — Telephone Encounter (Signed)
Ok to refill??  Last office visit 07/21/2018  Last refill 01/28/2019.

## 2019-03-18 ENCOUNTER — Encounter: Payer: Self-pay | Admitting: Gynecology

## 2019-03-31 ENCOUNTER — Other Ambulatory Visit: Payer: Self-pay | Admitting: Family Medicine

## 2019-03-31 NOTE — Telephone Encounter (Signed)
Pt is requesting refill on Xanax   LOV: 07/21/2018  LRF:  03/01/19

## 2019-05-03 ENCOUNTER — Other Ambulatory Visit: Payer: Self-pay | Admitting: Family Medicine

## 2019-05-03 NOTE — Telephone Encounter (Signed)
Ok to refill??  Last office visit 07/21/2018.  Last refill 04/01/2019.

## 2019-05-04 DIAGNOSIS — K219 Gastro-esophageal reflux disease without esophagitis: Secondary | ICD-10-CM | POA: Diagnosis not present

## 2019-05-04 DIAGNOSIS — K582 Mixed irritable bowel syndrome: Secondary | ICD-10-CM | POA: Diagnosis not present

## 2019-06-01 ENCOUNTER — Other Ambulatory Visit: Payer: Self-pay | Admitting: Family Medicine

## 2019-06-01 NOTE — Telephone Encounter (Signed)
Ok to refill??  Last office visit 07/21/2018.  Last refill 05/03/2019.

## 2019-06-30 ENCOUNTER — Ambulatory Visit: Payer: Medicare Other | Attending: Internal Medicine

## 2019-06-30 DIAGNOSIS — Z23 Encounter for immunization: Secondary | ICD-10-CM | POA: Diagnosis not present

## 2019-06-30 NOTE — Progress Notes (Signed)
   Covid-19 Vaccination Clinic  Name:  Melissa Owen    MRN: [id number] DOB: 11-Jul-1945  06/30/2019  Melissa Owen was observed post Covid-19 immunization for 15 minutes without incidence. She was provided with Vaccine Information Sheet and instruction to access the V-Safe system.   Melissa Owen was instructed to call 911 with any severe reactions post vaccine: Marland Kitchen Difficulty breathing  . Swelling of your face and throat  . A fast heartbeat  . A bad rash all over your body  . Dizziness and weakness    Immunizations Administered    Name Date Dose VIS Date Route   Pfizer COVID-19 Vaccine 06/30/2019  4:17 PM 0.3 mL 05/21/2019 Intramuscular   Manufacturer: [place]   Lot: [id number]   [place]: [id number]

## 2019-07-02 ENCOUNTER — Other Ambulatory Visit: Payer: Self-pay | Admitting: Family Medicine

## 2019-07-02 NOTE — Telephone Encounter (Signed)
Requested Prescriptions   Pending Prescriptions Disp Refills  . ALPRAZolam (XANAX) 0.25 MG tablet [Pharmacy Med Name: ALPRAZOLAM 0.25MG  TABLETS] 90 tablet 0    Sig: TAKE 1 TABLET(0.25 MG) BY MOUTH THREE TIMES DAILY AS NEEDED    Last OV 07/21/2018   Last written 06/01/2019

## 2019-07-08 ENCOUNTER — Ambulatory Visit: Payer: Medicare Other

## 2019-07-19 ENCOUNTER — Ambulatory Visit: Payer: Medicare Other

## 2019-07-20 ENCOUNTER — Other Ambulatory Visit: Payer: Self-pay

## 2019-07-21 ENCOUNTER — Ambulatory Visit (INDEPENDENT_AMBULATORY_CARE_PROVIDER_SITE_OTHER): Payer: Medicare Other | Admitting: Obstetrics and Gynecology

## 2019-07-21 ENCOUNTER — Encounter: Payer: Self-pay | Admitting: Obstetrics and Gynecology

## 2019-07-21 ENCOUNTER — Ambulatory Visit: Payer: Medicare Other | Attending: Internal Medicine

## 2019-07-21 VITALS — BP 124/82 | Ht 60.0 in | Wt 126.0 lb

## 2019-07-21 DIAGNOSIS — N8111 Cystocele, midline: Secondary | ICD-10-CM | POA: Insufficient documentation

## 2019-07-21 DIAGNOSIS — Z01419 Encounter for gynecological examination (general) (routine) without abnormal findings: Secondary | ICD-10-CM | POA: Diagnosis not present

## 2019-07-21 DIAGNOSIS — M8580 Other specified disorders of bone density and structure, unspecified site: Secondary | ICD-10-CM | POA: Diagnosis not present

## 2019-07-21 DIAGNOSIS — Z79890 Hormone replacement therapy (postmenopausal): Secondary | ICD-10-CM | POA: Diagnosis not present

## 2019-07-21 DIAGNOSIS — R300 Dysuria: Secondary | ICD-10-CM | POA: Diagnosis not present

## 2019-07-21 DIAGNOSIS — Z23 Encounter for immunization: Secondary | ICD-10-CM | POA: Insufficient documentation

## 2019-07-21 MED ORDER — ESTRADIOL 0.5 MG PO TABS: ORAL_TABLET | ORAL | 4 refills | Status: DC

## 2019-07-21 MED ORDER — NONFORMULARY OR COMPOUNDED ITEM: 3 refills | Status: DC

## 2019-07-21 NOTE — Progress Notes (Signed)
   Covid-19 Vaccination Clinic  Name:  Melissa Owen    MRN: [id number] DOB: [birth year]  07/21/2019  Melissa Owen was observed post Covid-19 immunization for 15 minutes without incidence. She was provided with Vaccine Information Sheet and instruction to access the V-Safe system.   Melissa Owen was instructed to call 911 with any severe reactions post vaccine: Marland Kitchen Difficulty breathing  . Swelling of your face and throat  . A fast heartbeat  . A bad rash all over your body  . Dizziness and weakness    Immunizations Administered    Name Date Dose VIS Date Route   Pfizer COVID-19 Vaccine 07/21/2019 10:58 AM 0.3 mL 05/21/2019 Intramuscular   Manufacturer: [place]   Lot: [id number]   [place]: [id number]

## 2019-07-21 NOTE — Patient Instructions (Signed)
Keep an eye on your urinary symptoms, we will notify you of your urine test results when available If urinary symptoms are bothersome please come back for further evaluation Please remember to schedule your yearly mammogram, and when comfortable, schedule the bone density scan and colonoscopy when recommended I have refilled your estrogen tablets and vaginal estrogen compounded cream

## 2019-07-21 NOTE — Progress Notes (Signed)
Melissa Owen 08-02-1945 [id number]  SUBJECTIVE:  74 y.o. [phone] female for annual routine gynecologic exam.  She has been continuing to use estradiol 0.5 mg tablets, but has weaned slightly to every other day use.  She has not been able to wean in the past due to unacceptable vasomotor and headache side effects.  She has noted some slight urinary discomfort and urinary frequency intermittently and feels a "bulge" coming from the vaginal area just needs past few weeks.  She does note some occasional leakage of urine with laughing or coughing, but it is not much.  She ran out of vaginal estrogen cream but would like to resume use.  Current Outpatient Medications  Medication Sig Dispense Refill  . ALPRAZolam (XANAX) 0.25 MG tablet TAKE 1 TABLET(0.25 MG) BY MOUTH THREE TIMES DAILY AS NEEDED 90 tablet 0  . aspirin EC 81 MG tablet Take 81 mg by mouth daily.    . Cholecalciferol (VITAMIN D PO) Take by mouth.    . estradiol (ESTRACE) 0.5 MG tablet TAKE 1 TABLET(0.5 MG) BY MOUTH DAILY 90 tablet 4  . famotidine (PEPCID) 20 MG tablet Take 20 mg by mouth 2 (two) times daily.    . meloxicam (MOBIC) 15 MG tablet TAKE 1 TABLET(15 MG) BY MOUTH DAILY 90 tablet 1  . Probiotic Product (PROBIOTIC PO) Take by mouth.    . Wheat Dextrin (BENEFIBER PO) Take by mouth.    . NONFORMULARY OR COMPOUNDED ITEM Estradiol vaginal cream 0.02% insert twice weekly vaginally (Patient not taking: Reported on 07/21/2019) 90 each 3  . rosuvastatin (CRESTOR) 20 MG tablet Take 1 tablet (20 mg total) by mouth daily. (Patient not taking: Reported on 07/21/2019) 90 tablet 3   No current facility-administered medications for this visit.   Allergies: Aspirin and Latex  No LMP recorded. Patient has had a hysterectomy.  Past medical history,surgical history, problem list, medications, allergies, family history and social history were all reviewed and documented as reviewed in the EPIC chart.  ROS:  Feeling well. No dyspnea or chest pain  on exertion.  No abdominal pain, change in bowel habits, black or bloody stools.  + Urinary symptoms as described above. GYN ROS: no abnormal bleeding, pelvic pain or discharge, no breast pain or new or enlarging lumps on self exam. No neurological complaints.   OBJECTIVE:  BP 124/82   Ht 5' (1.524 m)   Wt 126 lb (57.2 kg)   BMI 24.61 kg/m  The patient appears well, alert, oriented x 3, in no distress. ENT normal.  Neck supple. No cervical or supraclavicular adenopathy or thyromegaly.  Lungs are clear, good air entry, no wheezes, rhonchi or rales. S1 and S2 normal, no murmurs, regular rate and rhythm.  Abdomen soft without tenderness, guarding, mass or organomegaly.  Neurological is normal, no focal findings.  BREAST EXAM: breasts appear normal, no suspicious masses, no skin or nipple changes or axillary nodes  PELVIC EXAM: VULVA: normal appearing vulva with no masses, tenderness or lesions, VAGINA: normal appearing vagina with normal color and discharge, no lesions, grade 1-2 cystocele with hypermobility of the urethra, CERVIX: surgically absent, UTERUS: surgically absent, vaginal cuff well healed and well supported, ADNEXA: no masses, nontender, RECTAL: normal rectal, no masses, no rectocele  Chaperone: Caryn Bee present during the examination  ASSESSMENT:  74 y.o. [id number] here for annual gynecologic exam  PLAN:   1. Postmenopausal.  Prior TAH for leiomyoma.  Continues on estradiol 0.5 mg but has decreased use to every other day. Has  tried weaning off completely but has unacceptable hot flushes and sweats in addition to headaches.  We again discussed the risks particularly moving into her 28s of increased risk of stroke heart attack DVT in the breast cancer issue.  At this point the patient wants to continue understanding and accepting the risks.  She feels it is a quality of life issue.  Refill x1 year provided.    She also would like to restart estradiol vaginal cream twice weekly  through [facility].  Feels that it helps with her bladder as far as incontinence.    This refill is called in for her today. 2.  Cystocele and urinary discomfort.  Mild to moderate.  We discussed some of the signs and symptoms that she should look out for and if bothering her she is welcome to come back to the office for further evaluation and management.  We will check a urinalysis today and notify her of the results later. 3. Pap smear 2012. Not repeated today. No prior history of abnormal Pap smears. She is comfortable with discontinuing screening based on age and history criteria. 4. Mammogram 2020. Will continue with annual mammography. Breast exam normal today. 5. Colonoscopy 2012. Recommended that she continue per the prescribed interval.  She indicates that she would like to wait until things calm down with the pandemic. 6. Osteopenia. DEXA 06/2015.  She was going to have this last year but then I got postponed due to the pandemic.  She indicates that she would like to wait until things calm down with the pandemic. 7. Health maintenance.  No lab work as she has this completed with her primary care provider.    Return annually or sooner, prn.  Joseph Pierini MD  07/21/19

## 2019-07-23 LAB — URINE CULTURE
MICRO NUMBER:: 10137225
Result:: NO GROWTH
SPECIMEN QUALITY:: ADEQUATE

## 2019-07-23 LAB — URINALYSIS, COMPLETE W/RFL CULTURE
Bilirubin Urine: NEGATIVE
Glucose, UA: NEGATIVE
Hgb urine dipstick: NEGATIVE
Hyaline Cast: NONE SEEN /LPF
Ketones, ur: NEGATIVE
Nitrites, Initial: NEGATIVE
Protein, ur: NEGATIVE
Specific Gravity, Urine: 1.002 (ref 1.001–1.03)
pH: 6 (ref 5.0–8.0)

## 2019-07-23 LAB — CULTURE INDICATED

## 2019-08-03 ENCOUNTER — Other Ambulatory Visit: Payer: Self-pay | Admitting: Family Medicine

## 2019-08-03 MED ORDER — ALPRAZOLAM 0.25 MG PO TABS: ORAL_TABLET | ORAL | 0 refills | Status: DC

## 2019-08-03 MED ORDER — ALPRAZOLAM 0.25 MG PO TABS: ORAL_TABLET | ORAL | 3 refills | Status: DC

## 2019-08-03 NOTE — Telephone Encounter (Signed)
Pt is requesting refill on Xanax   LOV: 07/21/2018  LRF:

## 2019-08-04 ENCOUNTER — Other Ambulatory Visit: Payer: Self-pay | Admitting: Family Medicine

## 2019-08-04 MED ORDER — MELOXICAM 15 MG PO TABS: ORAL_TABLET | ORAL | 0 refills | Status: DC

## 2019-08-30 ENCOUNTER — Other Ambulatory Visit: Payer: Self-pay | Admitting: Family Medicine

## 2019-08-30 NOTE — Telephone Encounter (Signed)
Pt is requesting refill on Xanax  (will send letter to schedule ov)   LOV: 07/21/18  LRF:   08/03/19

## 2019-09-30 ENCOUNTER — Other Ambulatory Visit: Payer: Self-pay | Admitting: Family Medicine

## 2019-09-30 NOTE — Telephone Encounter (Signed)
Ok to refill??  Last office visit 07/21/2018.  Last refill 08/30/2019.  Of note, patient has OV on 10/04/2019.

## 2019-10-01 ENCOUNTER — Other Ambulatory Visit: Payer: Self-pay

## 2019-10-01 MED ORDER — ESTRADIOL 0.5 MG PO TABS: ORAL_TABLET | ORAL | 4 refills | Status: DC

## 2019-10-04 ENCOUNTER — Other Ambulatory Visit: Payer: Self-pay

## 2019-10-04 ENCOUNTER — Encounter: Payer: Self-pay | Admitting: Family Medicine

## 2019-10-04 ENCOUNTER — Ambulatory Visit (INDEPENDENT_AMBULATORY_CARE_PROVIDER_SITE_OTHER): Payer: Medicare Other | Admitting: Family Medicine

## 2019-10-04 VITALS — BP 170/92 | HR 90 | Temp 97.5°F | Resp 18 | Ht 63.0 in | Wt 127.0 lb

## 2019-10-04 DIAGNOSIS — M8580 Other specified disorders of bone density and structure, unspecified site: Secondary | ICD-10-CM | POA: Diagnosis not present

## 2019-10-04 DIAGNOSIS — I6521 Occlusion and stenosis of right carotid artery: Secondary | ICD-10-CM | POA: Diagnosis not present

## 2019-10-04 DIAGNOSIS — E7800 Pure hypercholesterolemia, unspecified: Secondary | ICD-10-CM

## 2019-10-04 DIAGNOSIS — Z0001 Encounter for general adult medical examination with abnormal findings: Secondary | ICD-10-CM

## 2019-10-04 DIAGNOSIS — Z0000 Encounter for general adult medical examination without abnormal findings: Secondary | ICD-10-CM | POA: Diagnosis not present

## 2019-10-04 DIAGNOSIS — F419 Anxiety disorder, unspecified: Secondary | ICD-10-CM | POA: Diagnosis not present

## 2019-10-04 DIAGNOSIS — Z23 Encounter for immunization: Secondary | ICD-10-CM

## 2019-10-04 LAB — COMPLETE METABOLIC PANEL WITH GFR
AG Ratio: 1.6 (calc) (ref 1.0–2.5)
ALT: 12 U/L (ref 6–29)
AST: 14 U/L (ref 10–35)
Albumin: 4.2 g/dL (ref 3.6–5.1)
Alkaline phosphatase (APISO): 66 U/L (ref 37–153)
BUN: 11 mg/dL (ref 7–25)
CO2: 30 mmol/L (ref 20–32)
Calcium: 9.8 mg/dL (ref 8.6–10.4)
Chloride: 105 mmol/L (ref 98–110)
Creat: 0.75 mg/dL (ref 0.60–0.93)
GFR, Est African American: 91 mL/min/{1.73_m2} (ref >=60)
GFR, Est Non African American: 79 mL/min/{1.73_m2} (ref >=60)
Globulin: 2.6 g/dL (calc) (ref 1.9–3.7)
Glucose, Bld: 101 mg/dL — ABNORMAL HIGH (ref 65–99)
Potassium: 4.3 mmol/L (ref 3.5–5.3)
Sodium: 143 mmol/L (ref 135–146)
Total Bilirubin: 0.5 mg/dL (ref 0.2–1.2)
Total Protein: 6.8 g/dL (ref 6.1–8.1)

## 2019-10-04 LAB — CBC WITH DIFFERENTIAL/PLATELET
Absolute Monocytes: 376 cells/uL (ref 200–950)
Basophils Absolute: 40 cells/uL (ref 0–200)
Basophils Relative: 0.6 %
Eosinophils Absolute: 53 cells/uL (ref 15–500)
Eosinophils Relative: 0.8 %
HCT: 42.7 % (ref 35.0–45.0)
Hemoglobin: 13.9 g/dL (ref 11.7–15.5)
Lymphs Abs: 1551 cells/uL (ref 850–3900)
MCH: 30.5 pg (ref 27.0–33.0)
MCHC: 32.6 g/dL (ref 32.0–36.0)
MCV: 93.6 fL (ref 80.0–100.0)
MPV: 11.1 fL (ref 7.5–12.5)
Monocytes Relative: 5.7 %
Neutro Abs: 4580 cells/uL (ref 1500–7800)
Neutrophils Relative %: 69.4 %
Platelets: 256 10*3/uL (ref 140–400)
RBC: 4.56 10*6/uL (ref 3.80–5.10)
RDW: 11.9 % (ref 11.0–15.0)
Total Lymphocyte: 23.5 %
WBC: 6.6 10*3/uL (ref 3.8–10.8)

## 2019-10-04 LAB — LIPID PANEL
Cholesterol: 239 mg/dL — ABNORMAL HIGH (ref <200)
HDL: 90 mg/dL (ref >=50)
LDL Cholesterol (Calc): 129 mg/dL (calc) — ABNORMAL HIGH
Non-HDL Cholesterol (Calc): 149 mg/dL (calc) — ABNORMAL HIGH (ref <130)
Total CHOL/HDL Ratio: 2.7 (calc) (ref <5.0)
Triglycerides: 102 mg/dL (ref <150)

## 2019-10-04 MED ORDER — LOSARTAN POTASSIUM 50 MG PO TABS
50.0000 mg | ORAL_TABLET | Freq: Every day | ORAL | 3 refills | Status: DC
Start: 2019-10-04 — End: 2020-09-25

## 2019-10-04 MED ORDER — ATORVASTATIN CALCIUM 20 MG PO TABS: 20.0000 mg | ORAL_TABLET | Freq: Every day | ORAL | 3 refills | Status: DC

## 2019-10-04 NOTE — Progress Notes (Signed)
Subjective:    Patient ID: Melissa Owen, female    DOB: [birth year], 74 y.o.   MRN: [id number]  HPI Subjective:   Patient presents for Medicare Annual/Subsequent preventive examination.   At her last CPE she was found to have a right bruit.  Carotid dopplers revealed 50-69% stenosis in R ICA.  Today I am having a difficult time hearing her carotid bruit.  She is taking an aspirin.  However she is unable to tolerate Crestor.  It caused severe muscle aches after taking it just 1 week.  She tried on 3 separate occasions.  Each time she would develop severe muscle aches within a week and would have to stop the medication.  Her blood pressure is elevated today however she is anxious.  I rechecked her blood pressure after sitting and talking with her for a while and it did drop to 158/92 however it still elevated.  We discussed starting medication and the patient is willing to go ahead and start losartan and then check her blood pressure twice a day for the next few weeks and notify me of the values.  She is due for a booster on her Pneumovax 23.  It states that she is due for colonoscopy however she has had a colonoscopy under the care of Dr. Arlana [facility].  She is due for repeat colonoscopy next year.  Her mammogram was performed in September.  She does not require a Pap smear because she has a history of a hysterectomy.  She denies any falls, depression, memory loss.  She is already had a Covid vaccination. Immunization History  Administered Date(s) Administered  . Influenza Whole 03/10/2009  . Influenza, High Dose Seasonal PF 02/25/2016, 03/12/2018  . Influenza,inj,Quad PF,6+ Mos 02/16/2013, 03/14/2014, 05/02/2015  . Influenza-Unspecified 03/10/2017  . PFIZER SARS-COV-2 Vaccination 06/30/2019, 07/21/2019  . Pneumococcal Conjugate-13 03/14/2014  . Pneumococcal Polysaccharide-23 03/30/2009  . Tdap 12/19/2008  . Zoster 12/19/2008  . Zoster Recombinat (Shingrix) 10/08/2016, 12/08/2016     Review Past Medical/Family/Social: Past Medical History:  Diagnosis Date  . Anxiety   . Carotid stenosis, asymptomatic, right    50-69% (2020)  . IBS (irritable bowel syndrome)   . Mitral valve prolapse   . Osteopenia 06/2015   T score - 2.0  . Urinary incontinence    Past Surgical History:  Procedure Laterality Date  . ABDOMINAL HYSTERECTOMY  age 51   Leiomyomata  . carpal tunnel repair    . TUBAL LIGATION     Current Outpatient Medications on File Prior to Visit  Medication Sig Dispense Refill  . ALPRAZolam (XANAX) 0.25 MG tablet TAKE 1 TABLET(0.25 MG) BY MOUTH THREE TIMES DAILY AS NEEDED 90 tablet 0  . aspirin EC 81 MG tablet Take 81 mg by mouth daily.    . Cholecalciferol (VITAMIN D PO) Take by mouth.    . estradiol (ESTRACE) 0.5 MG tablet TAKE 1 TABLET(0.5 MG) BY MOUTH DAILY 90 tablet 4  . famotidine (PEPCID) 20 MG tablet Take 20 mg by mouth 2 (two) times daily.    . meloxicam (MOBIC) 15 MG tablet TAKE 1 TABLET(15 MG) BY MOUTH DAILY 90 tablet 0  . NONFORMULARY OR COMPOUNDED ITEM Estradiol vaginal cream 0.02% insert twice weekly vaginally 90 each 3  . Probiotic Product (PROBIOTIC PO) Take by mouth.    . rosuvastatin (CRESTOR) 20 MG tablet Take 1 tablet (20 mg total) by mouth daily. (Patient not taking: Reported on 07/21/2019) 90 tablet 3  . Wheat Dextrin (BENEFIBER PO) Take by  mouth.     No current facility-administered medications on file prior to visit.   Allergies  Allergen Reactions  . Aspirin Nausea And Vomiting  . Latex Itching   Social History   Socioeconomic History  . Marital status: Divorced    Spouse name: Not on file  . Number of children: Not on file  . Years of education: Not on file  . Highest education level: Not on file  Occupational History  . Not on file  Tobacco Use  . Smoking status: Never Smoker  . Smokeless tobacco: Never Used  Substance and Sexual Activity  . Alcohol use: Never    Alcohol/week: 0.0 standard drinks  . Drug use: No   . Sexual activity: Not Currently    Comment: 1st intercourse 74 yo-Fewer than 5 partners  Other Topics Concern  . Not on file  Social History Narrative  . Not on file   Social Determinants of Health   Financial Resource Strain:   . Difficulty of Paying Living Expenses:   Food Insecurity:   . Worried About Charity fundraiser in the Last Year:   . Arboriculturist in the Last Year:   Transportation Needs:   . Film/video editor (Medical):   Marland Kitchen Lack of Transportation (Non-Medical):   Physical Activity:   . Days of Exercise per Week:   . Minutes of Exercise per Session:   Stress:   . Feeling of Stress :   Social Connections:   . Frequency of Communication with Friends and Family:   . Frequency of Social Gatherings with Friends and Family:   . Attends Religious Services:   . Active Member of Clubs or Organizations:   . Attends Archivist Meetings:   Marland Kitchen Marital Status:   Intimate Partner Violence:   . Fear of Current or Ex-Partner:   . Emotionally Abused:   Marland Kitchen Physically Abused:   . Sexually Abused:    Family History  Problem Relation Age of Onset  . Dementia Mother   . Heart disease Mother 4  . Heart disease Father   . Cancer Maternal Grandmother        colon cancer    Review of Systems  All other systems reviewed and are negative.      Objective:   Physical Exam  Constitutional: She is oriented to person, place, and time. She appears well-developed and well-nourished. No distress.  HENT:  Head: Normocephalic and atraumatic.  Right Ear: External ear normal.  Left Ear: External ear normal.  Nose: Nose normal.  Mouth/Throat: Oropharynx is clear and moist. No oropharyngeal exudate.  Eyes: Pupils are equal, round, and reactive to light. Conjunctivae and EOM are normal. Right eye exhibits no discharge. Left eye exhibits no discharge. No scleral icterus.  Neck: No JVD present. No tracheal deviation present. No thyromegaly present.  Cardiovascular: Normal  rate, regular rhythm, normal heart sounds and intact distal pulses. Exam reveals no gallop and no friction rub.  No murmur heard. Pulmonary/Chest: Effort normal and breath sounds normal. No stridor. No respiratory distress. She has no wheezes. She has no rales. She exhibits no tenderness.  Abdominal: Soft. Bowel sounds are normal. She exhibits no distension and no mass. There is no abdominal tenderness. There is no rebound and no guarding.  Musculoskeletal:        General: No tenderness or edema. Normal range of motion.     Cervical back: Normal range of motion and neck supple.  Lymphadenopathy:  She has no cervical adenopathy.  Neurological: She is alert and oriented to person, place, and time. She has normal reflexes. No cranial nerve deficit. She exhibits normal muscle tone. Coordination normal.  Skin: Skin is warm. No rash noted. She is not diaphoretic. No erythema. No pallor.  Psychiatric: She has a normal mood and affect. Her behavior is normal. Judgment and thought content normal.  Vitals reviewed.       Assessment & Plan:  Routine general medical examination at a health care facility - Plan: CBC with Differential/Platelet, COMPLETE METABOLIC PANEL WITH GFR, Lipid panel  Carotid stenosis, asymptomatic, right - Plan: CBC with Differential/Platelet, COMPLETE METABOLIC PANEL WITH GFR, Lipid panel, Ambulatory referral to Vascular Surgery  Anxiety  Osteopenia, unspecified location  Pure hypercholesterolemia - Plan: CBC with Differential/Platelet, COMPLETE METABOLIC PANEL WITH GFR, Lipid panel  Blood pressure today is elevated.  Some of this may be anxiety related however I still feel that it is too high given her known carotid bruit.  Therefore we will start the patient on losartan 50 mg a day and recheck her blood pressure via telephone in 1 to 2 weeks.  Patient is compliant with her aspirin.  We will try different statin.  I will start her on low-dose Lipitor 20 mg a day and hope  that she will better be able to tolerate it.  Her mammogram is up-to-date.  Her colonoscopy is due next year.  She does not require Pap smear.  She did receive a booster on Pneumovax 23 today.  I will schedule the patient to meet with a vascular surgeon to follow along with this.  I will allow them to order the carotid Dopplers through their office.  Check fasting lipid panel.  Goal LDL cholesterol is less than 70.

## 2019-10-05 ENCOUNTER — Encounter: Payer: Self-pay | Admitting: Family Medicine

## 2019-10-06 DIAGNOSIS — E7800 Pure hypercholesterolemia, unspecified: Secondary | ICD-10-CM | POA: Diagnosis not present

## 2019-10-06 DIAGNOSIS — F419 Anxiety disorder, unspecified: Secondary | ICD-10-CM | POA: Diagnosis not present

## 2019-10-06 DIAGNOSIS — Z0001 Encounter for general adult medical examination with abnormal findings: Secondary | ICD-10-CM | POA: Diagnosis not present

## 2019-10-06 DIAGNOSIS — I6521 Occlusion and stenosis of right carotid artery: Secondary | ICD-10-CM | POA: Diagnosis not present

## 2019-10-06 DIAGNOSIS — M8580 Other specified disorders of bone density and structure, unspecified site: Secondary | ICD-10-CM | POA: Diagnosis not present

## 2019-10-06 DIAGNOSIS — Z23 Encounter for immunization: Secondary | ICD-10-CM | POA: Diagnosis not present

## 2019-10-06 NOTE — Addendum Note (Signed)
Addended by: Shary Decamp B on: 10/06/2019 05:09 PM   Modules accepted: Orders

## 2019-10-29 ENCOUNTER — Other Ambulatory Visit: Payer: Self-pay | Admitting: Family Medicine

## 2019-10-29 NOTE — Telephone Encounter (Signed)
Requested Prescriptions   Pending Prescriptions Disp Refills  . ALPRAZolam (XANAX) 0.25 MG tablet [Pharmacy Med Name: ALPRAZOLAM 0.25MG  TABLETS] 90 tablet 0    Sig: TAKE 1 TABLET(0.25 MG) BY MOUTH THREE TIMES DAILY AS NEEDED     Last OV 10/04/2019    Last written 10/04/2019

## 2019-11-01 ENCOUNTER — Other Ambulatory Visit: Payer: Self-pay | Admitting: Family Medicine

## 2019-11-12 ENCOUNTER — Other Ambulatory Visit: Payer: Self-pay | Admitting: *Deleted

## 2019-11-12 DIAGNOSIS — I6521 Occlusion and stenosis of right carotid artery: Secondary | ICD-10-CM

## 2019-11-16 ENCOUNTER — Ambulatory Visit (HOSPITAL_COMMUNITY)
Admission: RE | Admit: 2019-11-16 | Discharge: 2019-11-16 | Disposition: A | Discharge status: Home / Self Care | Payer: Medicare Other | Source: Ambulatory Visit | Attending: Vascular Surgery | Admitting: Vascular Surgery

## 2019-11-16 ENCOUNTER — Ambulatory Visit (INDEPENDENT_AMBULATORY_CARE_PROVIDER_SITE_OTHER): Payer: Medicare Other | Admitting: Vascular Surgery

## 2019-11-16 ENCOUNTER — Other Ambulatory Visit: Payer: Self-pay

## 2019-11-16 ENCOUNTER — Encounter: Payer: Self-pay | Admitting: Vascular Surgery

## 2019-11-16 VITALS — BP 146/83 | HR 76 | Temp 97.9°F | Resp 20 | Ht 63.0 in | Wt 127.0 lb

## 2019-11-16 DIAGNOSIS — I6521 Occlusion and stenosis of right carotid artery: Secondary | ICD-10-CM

## 2019-11-16 NOTE — Progress Notes (Signed)
[facility]  Patient name: Melissa Owen MRN: [id number] DOB: April 23, 1946 Sex: female  REASON FOR CONSULT: Evaluation asymptomatic right carotid stenosis  HPI: Melissa Owen is a 74 y.o. female, who is here today for evaluation.  She was found to have carotid stenosis on duplex of asymptomatic bruit on February 2020.  She specifically denies any prior amaurosis fugax, aphasia or stroke.  She is very healthy.  She is never smoked.  He does have a history of mitral valve prolapse.  She remains extremely active at her age of 74.  Past Medical History:  Diagnosis Date  . Anxiety   . Carotid stenosis, asymptomatic, right    50-69% (2020)  . IBS (irritable bowel syndrome)   . Mitral valve prolapse   . Osteopenia 06/2015   T score - 2.0  . Urinary incontinence     Family History  Problem Relation Age of Onset  . Dementia Mother   . Heart disease Mother 57  . Heart disease Father   . Cancer Maternal Grandmother        colon cancer    SOCIAL HISTORY: Social History   Socioeconomic History  . Marital status: Divorced    Spouse name: Not on file  . Number of children: Not on file  . Years of education: Not on file  . Highest education level: Not on file  Occupational History  . Not on file  Tobacco Use  . Smoking status: Never Smoker  . Smokeless tobacco: Never Used  Substance and Sexual Activity  . Alcohol use: Never    Alcohol/week: 0.0 standard drinks  . Drug use: No  . Sexual activity: Not Currently    Comment: 1st intercourse 74 yo-Fewer than 5 partners  Other Topics Concern  . Not on file  Social History Narrative  . Not on file   Social Determinants of Health   Financial Resource Strain:   . Difficulty of Paying Living Expenses:   Food Insecurity:   . Worried About Charity fundraiser in the Last Year:   . Arboriculturist in the Last Year:   Transportation Needs:   . Film/video editor  (Medical):   Marland Kitchen Lack of Transportation (Non-Medical):   Physical Activity:   . Days of Exercise per Week:   . Minutes of Exercise per Session:   Stress:   . Feeling of Stress :   Social Connections:   . Frequency of Communication with Friends and Family:   . Frequency of Social Gatherings with Friends and Family:   . Attends Religious Services:   . Active Member of Clubs or Organizations:   . Attends Archivist Meetings:   Marland Kitchen Marital Status:   Intimate Partner Violence:   . Fear of Current or Ex-Partner:   . Emotionally Abused:   Marland Kitchen Physically Abused:   . Sexually Abused:     Allergies  Allergen Reactions  . Aspirin Nausea And Vomiting  . Latex Itching    Current Outpatient Medications  Medication Sig Dispense Refill  . ALPRAZolam (XANAX) 0.25 MG tablet TAKE 1 TABLET(0.25 MG) BY MOUTH THREE TIMES DAILY AS NEEDED 90 tablet 0  . aspirin EC 81 MG tablet Take 81 mg by mouth daily.    Marland Kitchen atorvastatin (LIPITOR) 20 MG tablet Take 1 tablet (20 mg total) by mouth daily. 90 tablet 3  . Cholecalciferol (VITAMIN D PO) Take by mouth.    . estradiol (ESTRACE) 0.5 MG tablet TAKE  1 TABLET(0.5 MG) BY MOUTH DAILY 90 tablet 4  . famotidine (PEPCID) 20 MG tablet Take 20 mg by mouth 2 (two) times daily.    Marland Kitchen losartan (COZAAR) 50 MG tablet Take 1 tablet (50 mg total) by mouth daily. 90 tablet 3  . meloxicam (MOBIC) 15 MG tablet TAKE 1 TABLET(15 MG) BY MOUTH DAILY 90 tablet 0  . Probiotic Product (PROBIOTIC PO) Take by mouth.    . Wheat Dextrin (BENEFIBER PO) Take by mouth.    . NONFORMULARY OR COMPOUNDED ITEM Estradiol vaginal cream 0.02% insert twice weekly vaginally (Patient not taking: Reported on 10/04/2019) 90 each 3   No current facility-administered medications for this visit.    REVIEW OF SYSTEMS:  [X]  denotes positive finding, [ ]  denotes negative finding Cardiac  Comments:  Chest pain or chest pressure:    Shortness of breath upon exertion: x   Short of breath when lying  flat:    Irregular heart rhythm:        Vascular    Pain in calf, thigh, or hip brought on by ambulation:    Pain in feet at night that wakes you up from your sleep:     Blood clot in your veins:    Leg swelling:         Pulmonary    Oxygen at home:    Productive cough:     Wheezing:         Neurologic    Sudden weakness in arms or legs:     Sudden numbness in arms or legs:     Sudden onset of difficulty speaking or slurred speech:    Temporary loss of vision in one eye:     Problems with dizziness:         Gastrointestinal    Blood in stool:     Vomited blood:         Genitourinary    Burning when urinating:     Blood in urine:        Psychiatric    Major depression:         Hematologic    Bleeding problems:    Problems with blood clotting too easily:        Skin    Rashes or ulcers:        Constitutional    Fever or chills:      PHYSICAL EXAM: Vitals:   11/16/19 1035 11/16/19 1037  BP: (!) 148/85 (!) 146/83  Pulse: 76   Resp: 20   Temp: 97.9 F (36.6 C)   SpO2: 98%   Weight: 127 lb (57.6 kg)   Height: 5\' 3"  (1.6 m)     GENERAL: The patient is a well-nourished female, in no acute distress. The vital signs are documented above. CARDIOVASCULAR: I do not hear a bruit today.  She has 2+ radial and 2+ dorsalis pedis pulses PULMONARY: There is good air exchange  ABDOMEN: Soft and non-tender  MUSCULOSKELETAL: There are no major deformities or cyanosis. NEUROLOGIC: No focal weakness or paresthesias are detected. SKIN: There are no ulcers or rashes noted. PSYCHIATRIC: The patient has a normal affect.  DATA:  Repeat carotid duplex today reveals no significant change.  Her right internal carotid artery is predicted at 40 to 59% stenosis.  She has 1 to 39% stenosis in her left carotid  MEDICAL ISSUES: I discussed this in detail with the patient.  She is right-handed.  I explained that we would be concerned regarding focal left  body symptoms or right eye  symptoms.  She knows to report immediately to the emergency room should she have any focal deficits.  Otherwise we will see her again in 1 year with repeat carotid duplex   Rosetta Posner, MD [facility] Tel [phone] Pager [phone]

## 2019-11-29 ENCOUNTER — Other Ambulatory Visit: Payer: Self-pay | Admitting: Family Medicine

## 2019-11-29 NOTE — Telephone Encounter (Signed)
Ok to refill??  Last office visit 10/04/2019.  Last refill 10/29/2019.

## 2019-12-28 ENCOUNTER — Other Ambulatory Visit: Payer: Self-pay | Admitting: Family Medicine

## 2019-12-28 NOTE — Telephone Encounter (Signed)
Requested Prescriptions   Pending Prescriptions Disp Refills   ALPRAZolam (XANAX) 0.25 MG tablet [Pharmacy Med Name: ALPRAZOLAM 0.25MG  TABLETS] 90 tablet 0    Sig: TAKE 1 TABLET(0.25 MG) BY MOUTH THREE TIMES DAILY AS NEEDED     Last OV 10/04/2019   Last written 11/29/2019

## 2020-01-28 ENCOUNTER — Other Ambulatory Visit: Payer: Self-pay | Admitting: Family Medicine

## 2020-01-28 NOTE — Telephone Encounter (Signed)
Ok to refill??  Last office visit 10/04/2019.  Last refill 12/28/2019.  Ok to add refills to prescription.

## 2020-02-19 DIAGNOSIS — Z23 Encounter for immunization: Secondary | ICD-10-CM | POA: Diagnosis not present

## 2020-02-26 DIAGNOSIS — Z1231 Encounter for screening mammogram for malignant neoplasm of breast: Secondary | ICD-10-CM | POA: Diagnosis not present

## 2020-02-26 LAB — HM MAMMOGRAPHY

## 2020-03-02 ENCOUNTER — Encounter: Payer: Self-pay | Admitting: *Deleted

## 2020-03-03 ENCOUNTER — Other Ambulatory Visit: Payer: Medicare Other

## 2020-03-03 ENCOUNTER — Other Ambulatory Visit: Payer: Self-pay

## 2020-03-03 DIAGNOSIS — I6521 Occlusion and stenosis of right carotid artery: Secondary | ICD-10-CM | POA: Diagnosis not present

## 2020-03-03 DIAGNOSIS — E7800 Pure hypercholesterolemia, unspecified: Secondary | ICD-10-CM | POA: Diagnosis not present

## 2020-03-03 DIAGNOSIS — Z23 Encounter for immunization: Secondary | ICD-10-CM | POA: Diagnosis not present

## 2020-03-04 LAB — CBC WITH DIFFERENTIAL/PLATELET
Absolute Monocytes: 464 cells/uL (ref 200–950)
Basophils Absolute: 49 cells/uL (ref 0–200)
Basophils Relative: 0.8 %
Eosinophils Absolute: 128 cells/uL (ref 15–500)
Eosinophils Relative: 2.1 %
HCT: 43 % (ref 35.0–45.0)
Hemoglobin: 13.8 g/dL (ref 11.7–15.5)
Lymphs Abs: 2214 cells/uL (ref 850–3900)
MCH: 30.7 pg (ref 27.0–33.0)
MCHC: 32.1 g/dL (ref 32.0–36.0)
MCV: 95.8 fL (ref 80.0–100.0)
MPV: 11.5 fL (ref 7.5–12.5)
Monocytes Relative: 7.6 %
Neutro Abs: 3245 cells/uL (ref 1500–7800)
Neutrophils Relative %: 53.2 %
Platelets: 222 10*3/uL (ref 140–400)
RBC: 4.49 10*6/uL (ref 3.80–5.10)
RDW: 12.1 % (ref 11.0–15.0)
Total Lymphocyte: 36.3 %
WBC: 6.1 10*3/uL (ref 3.8–10.8)

## 2020-03-04 LAB — COMPLETE METABOLIC PANEL WITH GFR
AG Ratio: 1.6 (calc) (ref 1.0–2.5)
ALT: 11 U/L (ref 6–29)
AST: 15 U/L (ref 10–35)
Albumin: 4.4 g/dL (ref 3.6–5.1)
Alkaline phosphatase (APISO): 58 U/L (ref 37–153)
BUN: 10 mg/dL (ref 7–25)
CO2: 29 mmol/L (ref 20–32)
Calcium: 9.7 mg/dL (ref 8.6–10.4)
Chloride: 101 mmol/L (ref 98–110)
Creat: 0.64 mg/dL (ref 0.60–0.93)
GFR, Est African American: 101 mL/min/{1.73_m2} (ref >=60)
GFR, Est Non African American: 87 mL/min/{1.73_m2} (ref >=60)
Globulin: 2.8 g/dL (calc) (ref 1.9–3.7)
Glucose, Bld: 98 mg/dL (ref 65–99)
Potassium: 4.6 mmol/L (ref 3.5–5.3)
Sodium: 140 mmol/L (ref 135–146)
Total Bilirubin: 0.6 mg/dL (ref 0.2–1.2)
Total Protein: 7.2 g/dL (ref 6.1–8.1)

## 2020-03-04 LAB — LIPID PANEL
Cholesterol: 187 mg/dL (ref <200)
HDL: 88 mg/dL (ref >=50)
LDL Cholesterol (Calc): 81 mg/dL (calc)
Non-HDL Cholesterol (Calc): 99 mg/dL (calc) (ref <130)
Total CHOL/HDL Ratio: 2.1 (calc) (ref <5.0)
Triglycerides: 93 mg/dL (ref <150)

## 2020-04-25 ENCOUNTER — Other Ambulatory Visit: Payer: Self-pay | Admitting: Family Medicine

## 2020-05-26 ENCOUNTER — Other Ambulatory Visit: Payer: Self-pay | Admitting: Family Medicine

## 2020-06-28 ENCOUNTER — Other Ambulatory Visit: Payer: Self-pay | Admitting: Family Medicine

## 2020-06-28 NOTE — Telephone Encounter (Signed)
Ok to refill??  Last office visit 10/04/2019.  Last refill 05/29/2020.  Ok to add refills to prescription?

## 2020-07-11 IMAGING — US US CAROTID DUPLEX BILAT
1 series · 13 of 24 positions shown · non-contrast
Comparison: None.

CLINICAL DATA: Right-sided carotid bruit. History of
hyperlipidemia.

EXAM:
BILATERAL CAROTID DUPLEX ULTRASOUND
TECHNIQUE: Gray scale imaging, color Doppler and duplex ultrasound were
performed of bilateral carotid and vertebral arteries in the neck.

[Series 1: us carotid duplex bilat · 0.06mm/px · 13 of 75 slices shown]
[im 1/75]
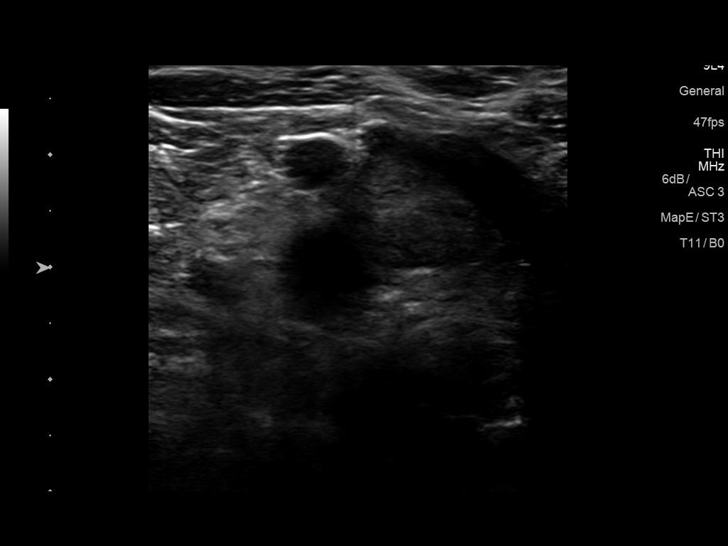
[im 7/75]
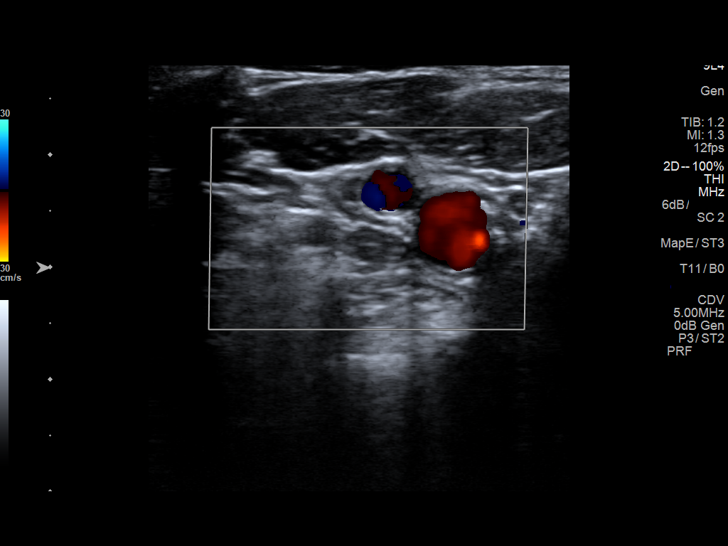
[im 13/75]
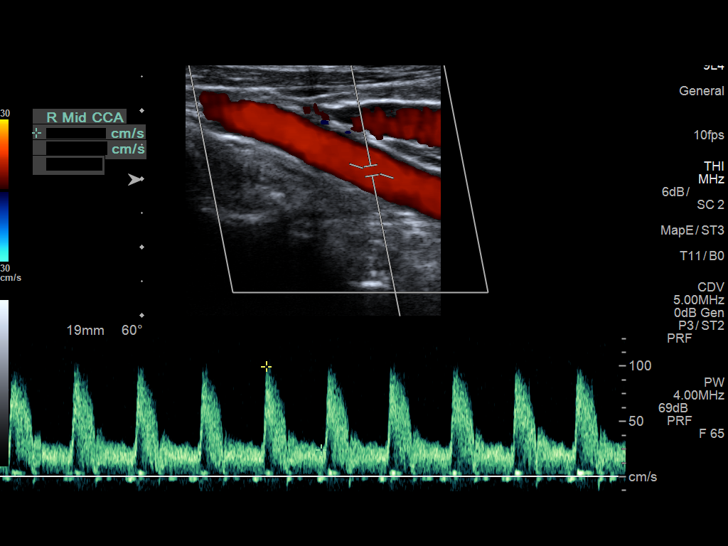
[im 20/75]
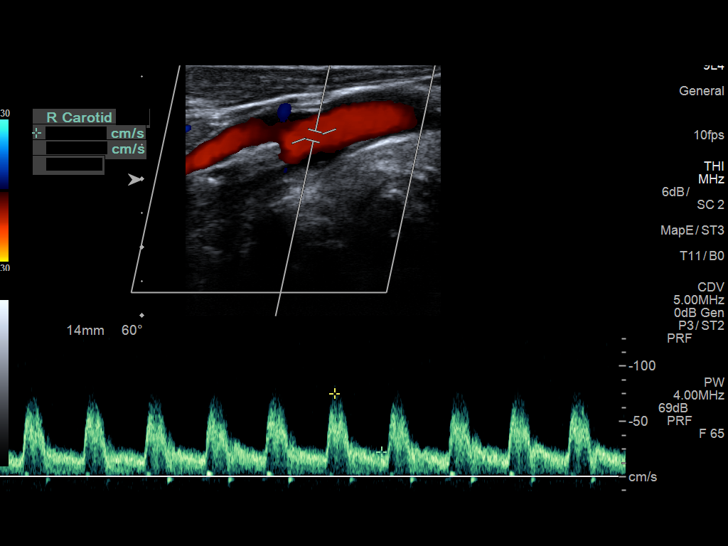
[im 26/75]
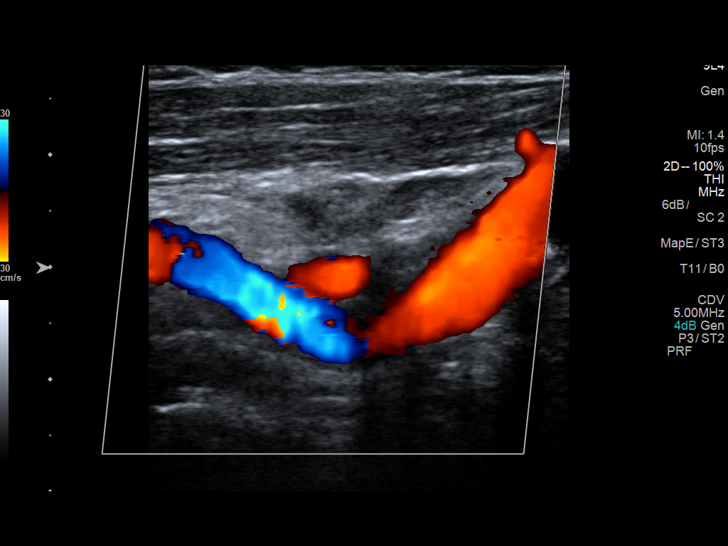
[im 33/75]
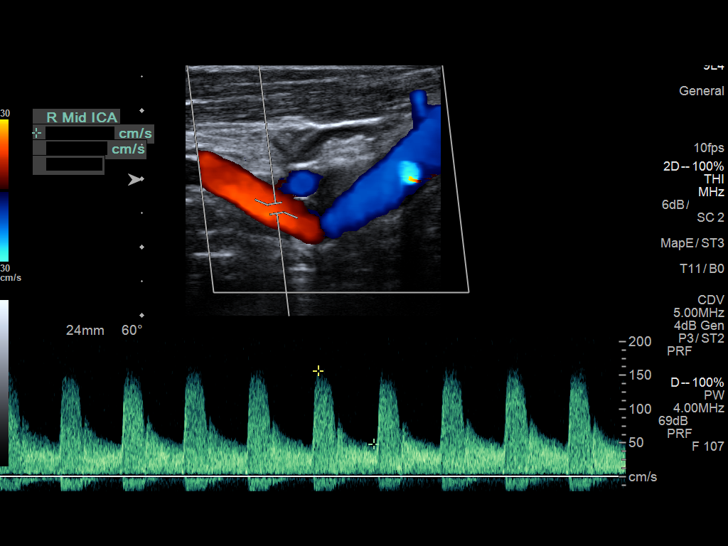
[im 39/75]
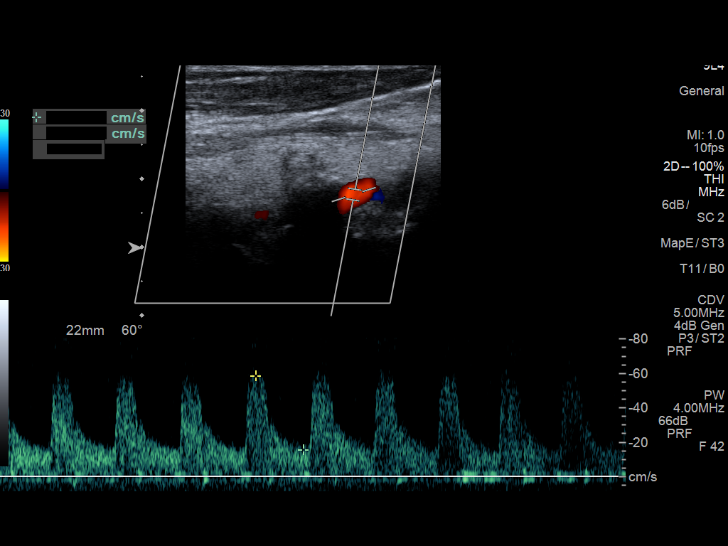
[im 42/75]
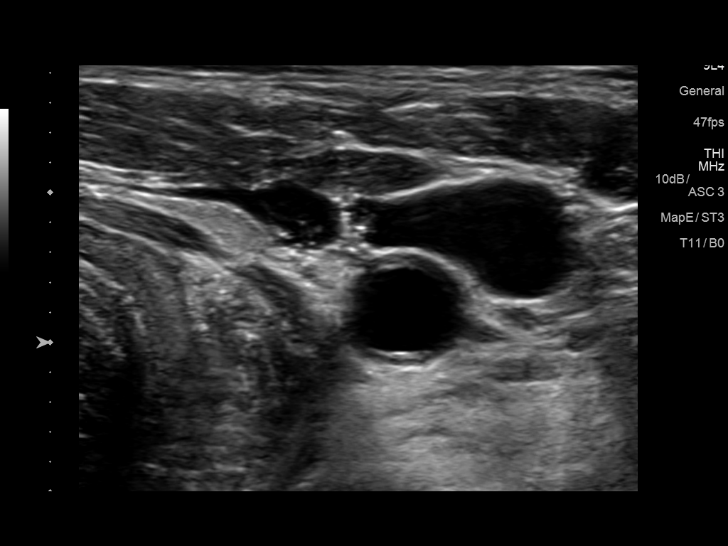
[im 49/75]
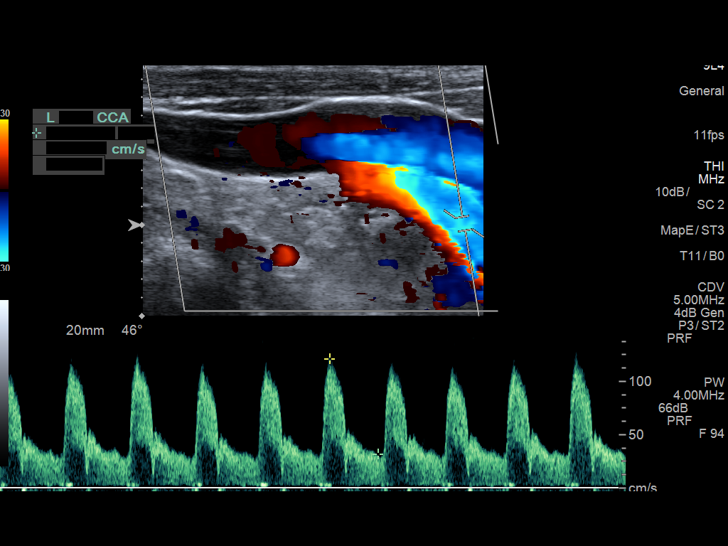
[im 55/75]
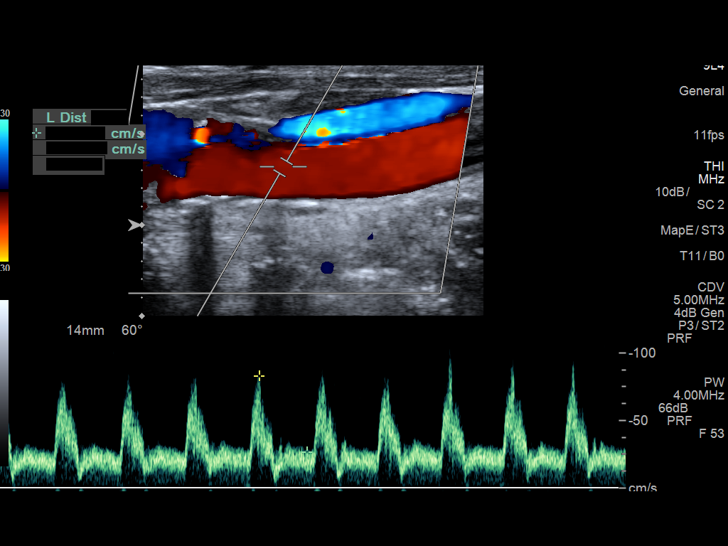
[im 62/75]
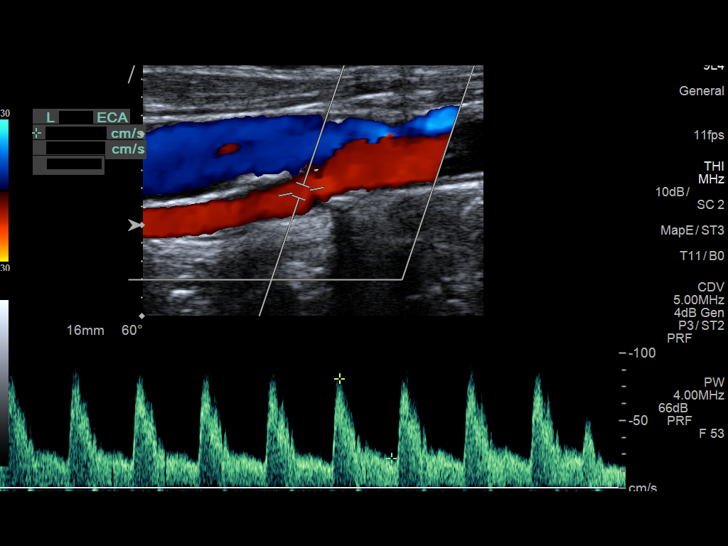
[im 68/75]
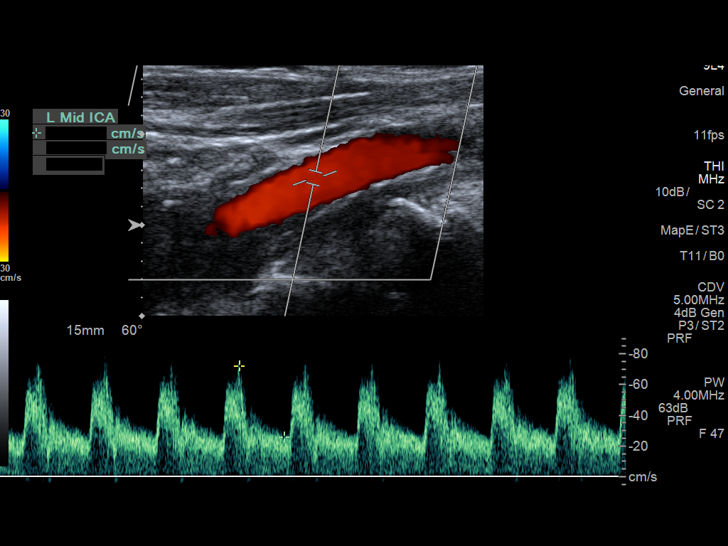
[im 75/75]
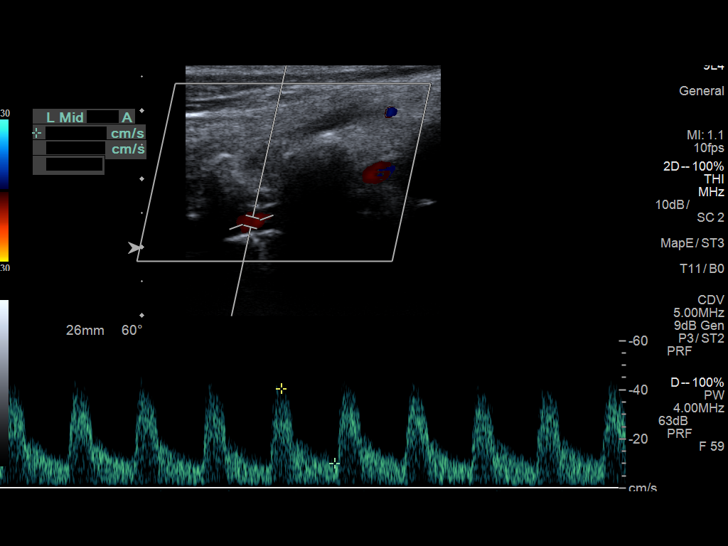

[13 of 24 positions shown; findings below may reference images not displayed]

FINDINGS: Criteria: Quantification of carotid stenosis is based on velocity
parameters that correlate the residual internal carotid diameter
with NASCET-based stenosis levels, using the diameter of the distal
internal carotid lumen as the denominator for stenosis measurement.

The following velocity measurements were obtained:

RIGHT

ICA: 156/48 cm/sec

CCA: 109/32 cm/sec

SYSTOLIC ICA/CCA RATIO:

ECA: 142 cm/sec

LEFT

ICA: 72/26 cm/sec

CCA: 121/32 cm/sec

SYSTOLIC ICA/CCA RATIO:

ECA: 81 cm/sec

RIGHT CAROTID ARTERY: There is a large amount of eccentric echogenic
plaque within the right carotid bulb, extending to involve the
origin and proximal aspects of the right internal carotid artery
(images 8, 19, 31 and 32, which results in elevated peak systolic
velocities within the mid and distal aspects of the right internal
carotid artery. Greatest acquired peak systolic velocity within the
distal aspect of the right internal carotid artery measures 156
centimeters/second (image 35).

RIGHT VERTEBRAL ARTERY:  Antegrade Flow

LEFT CAROTID ARTERY: There is a moderate amount of eccentric
echogenic plaque within the left carotid bulb (image 58), not
resulting in elevated peak systolic velocities within the
interrogated course of the left internal carotid artery. Borderline
elevated peak systolic velocity within distal aspect the left
internal carotid artery is felt to be factitiously elevated due to
sampling at a location turbulent flow.

LEFT VERTEBRAL ARTERY:  Antegrade Flow

Upper extremity blood pressures: RIGHT: 121/76 LEFT: 140/77
IMPRESSION: 1. Large amount of right-sided atherosclerotic plaque results in
elevated peak systolic velocities within the right internal carotid
artery compatible with the 50-69% luminal narrowing range. Further
evaluation with CTA could be performed as clinically indicated.
2. Minimal amount of left-sided atherosclerotic plaque, not
resulting in a hemodynamically significant stenosis.

## 2020-07-21 ENCOUNTER — Encounter: Payer: Self-pay | Admitting: Obstetrics and Gynecology

## 2020-07-21 ENCOUNTER — Telehealth: Payer: Self-pay | Admitting: *Deleted

## 2020-07-21 ENCOUNTER — Other Ambulatory Visit: Payer: Self-pay

## 2020-07-21 ENCOUNTER — Ambulatory Visit (INDEPENDENT_AMBULATORY_CARE_PROVIDER_SITE_OTHER): Payer: Medicare Other | Admitting: Obstetrics and Gynecology

## 2020-07-21 VITALS — BP 132/80 | HR 88 | Ht 60.75 in | Wt 130.0 lb

## 2020-07-21 DIAGNOSIS — Z01419 Encounter for gynecological examination (general) (routine) without abnormal findings: Secondary | ICD-10-CM | POA: Diagnosis not present

## 2020-07-21 DIAGNOSIS — M8580 Other specified disorders of bone density and structure, unspecified site: Secondary | ICD-10-CM

## 2020-07-21 DIAGNOSIS — Z79890 Hormone replacement therapy (postmenopausal): Secondary | ICD-10-CM

## 2020-07-21 DIAGNOSIS — N8111 Cystocele, midline: Secondary | ICD-10-CM

## 2020-07-21 MED ORDER — NONFORMULARY OR COMPOUNDED ITEM: 3 refills | Status: DC

## 2020-07-21 MED ORDER — ESTRADIOL 0.5 MG PO TABS
ORAL_TABLET | ORAL | 3 refills | Status: DC
Start: 2020-07-21 — End: 2021-07-25

## 2020-07-21 MED ORDER — NONFORMULARY OR COMPOUNDED ITEM
3 refills | Status: DC
Start: 2020-07-21 — End: 2020-07-21

## 2020-07-21 NOTE — Telephone Encounter (Signed)
Rx called in 

## 2020-07-21 NOTE — Telephone Encounter (Signed)
-----   Message from Joseph Pierini, MD sent at 07/21/2020  8:24 AM EST ----- Regarding: estrogen cream Please call in the vaginal estrogen cream to [facility], thank you

## 2020-07-21 NOTE — Progress Notes (Signed)
Melissa Owen 07-Oct-1945 [id number]  SUBJECTIVE:  75 y.o. [id number] female for annual routine gynecologic exam.  No gynecologic concerns. Has been having some abdominal pain and bloating but thinks this may be due to IBS.  Current Outpatient Medications  Medication Sig Dispense Refill   ALPRAZolam (XANAX) 0.25 MG tablet TAKE 1 TABLET(0.25 MG) BY MOUTH THREE TIMES DAILY AS NEEDED 90 tablet 3   aspirin EC 81 MG tablet Take 81 mg by mouth daily.     atorvastatin (LIPITOR) 20 MG tablet Take 1 tablet (20 mg total) by mouth daily. 90 tablet 3   Cholecalciferol (VITAMIN D PO) Take by mouth.     estradiol (ESTRACE) 0.5 MG tablet TAKE 1 TABLET(0.5 MG) BY MOUTH DAILY 90 tablet 4   famotidine (PEPCID) 20 MG tablet Take 20 mg by mouth 2 (two) times daily.     losartan (COZAAR) 50 MG tablet Take 1 tablet (50 mg total) by mouth daily. 90 tablet 3   meloxicam (MOBIC) 15 MG tablet TAKE 1 TABLET(15 MG) BY MOUTH DAILY 90 tablet 0   Wheat Dextrin (BENEFIBER PO) Take by mouth.     NONFORMULARY OR COMPOUNDED ITEM Estradiol vaginal cream 0.02% insert twice weekly vaginally (Patient not taking: No sig reported) 90 each 3   Probiotic Product (PROBIOTIC PO) Take by mouth. (Patient not taking: Reported on 07/21/2020)     No current facility-administered medications for this visit.   Allergies: Aspirin and Latex  No LMP recorded. Patient has had a hysterectomy.  Past medical history,surgical history, problem list, medications, allergies, family history and social history were all reviewed and documented as reviewed in the EPIC chart.  GYN ROS: no abnormal bleeding, pelvic pain or discharge, no breast pain or new or enlarging lumps on self exam. No neurological complaints.   OBJECTIVE:  BP (!) 148/70 (BP Location: Right Arm, Patient Position: Sitting, Cuff Size: Normal)    Pulse 88    Ht 5' 0.75" (1.543 m)    Wt 130 lb (59 kg)    BMI 24.77 kg/m  The patient appears well, alert, oriented, in no  distress. BREAST EXAM: breasts appear normal, no suspicious masses, no skin or nipple changes or axillary nodes  PELVIC EXAM: VULVA: normal appearing vulva with atrophic changes, no masses, tenderness or lesions, VAGINA: normal appearing vagina with normal color and discharge, no lesions, stable grade 1-2 cystocele, CERVIX: surgically absent, UTERUS: surgically absent, vaginal cuff normal and well supported, ADNEXA: no masses, nontender, RECTAL: normal rectal, no masses, no rectocele  Chaperone: Terence Lux present during the examination  ASSESSMENT:  75 y.o. [id number] here for annual gynecologic exam  PLAN:   1. Postmenopausal.  Prior TAH for leiomyoma.  Continues on estradiol 0.5 mg but has decreased use to every other day. Has tried weaning off completely but has unacceptable hot flushes and sweats in addition to headaches.  We again discussed the risks of increased risk of stroke heart attack DVT and the breast cancer issue.  At this point the patient wants to continue understanding and accepting the risks.  She feels it is a quality of life issue.  Refill x1 year provided.    She also would like to restart estradiol vaginal cream twice weekly through [facility].  Feels that it helps with her bladder as far as incontinence.    This refill is called in for her today. 2.  Cystocele and urinary discomfort.  Mild to moderate.  No dysuria/flank pain, but feels a little more bladder  symptoms such as vaginal pressure but also has not used the vaginal estrogen cream in the last year because she says she never got notified of the refill.  She will try resuming the cream as directed and then let us know in the next few months if the bladder symptoms are not getting any better. 3. Pap smear 2012. Not repeated today. No prior history of abnormal Pap smears. She is comfortable with discontinuing screening based on age and history criteria. 4. Mammogram 02/2020. Will continue with annual mammography.  Breast exam normal today. 5. Colonoscopy 2012. Recommended that she look into scheduling a repeat colonoscopy.  She is planning to see GI in April regarding her abdominal symptoms and will plan to look into scheduling the colonoscopy then. 6. Osteopenia. DEXA 06/2015.  She indicates that she will plan to schedule a repeat DEXA soon. 7. Health maintenance.  No lab work as she has this completed elsewhere.  The patient is aware that I will only be at this practice until early March 2022 so she knows to make sure she requests follow-up on results for any medical tests that she does when I am no longer at the practice.   Return annually or sooner, prn.  Joseph Pierini MD  07/21/20

## 2020-07-25 ENCOUNTER — Other Ambulatory Visit: Payer: Self-pay | Admitting: Family Medicine

## 2020-09-11 DIAGNOSIS — Z8719 Personal history of other diseases of the digestive system: Secondary | ICD-10-CM | POA: Diagnosis not present

## 2020-09-11 DIAGNOSIS — Z1211 Encounter for screening for malignant neoplasm of colon: Secondary | ICD-10-CM | POA: Diagnosis not present

## 2020-09-11 DIAGNOSIS — R1013 Epigastric pain: Secondary | ICD-10-CM | POA: Diagnosis not present

## 2020-09-19 DIAGNOSIS — Z23 Encounter for immunization: Secondary | ICD-10-CM | POA: Diagnosis not present

## 2020-09-25 ENCOUNTER — Other Ambulatory Visit: Payer: Self-pay | Admitting: Family Medicine

## 2020-10-17 ENCOUNTER — Other Ambulatory Visit: Payer: Self-pay

## 2020-10-17 DIAGNOSIS — I6521 Occlusion and stenosis of right carotid artery: Secondary | ICD-10-CM

## 2020-10-30 ENCOUNTER — Other Ambulatory Visit: Payer: Self-pay | Admitting: Family Medicine

## 2020-11-09 ENCOUNTER — Encounter: Payer: Medicare Other | Admitting: Family Medicine

## 2020-11-10 ENCOUNTER — Other Ambulatory Visit: Payer: Self-pay

## 2020-11-10 ENCOUNTER — Ambulatory Visit (INDEPENDENT_AMBULATORY_CARE_PROVIDER_SITE_OTHER): Payer: Medicare Other | Admitting: Family Medicine

## 2020-11-10 ENCOUNTER — Encounter: Payer: Self-pay | Admitting: Family Medicine

## 2020-11-10 VITALS — BP 144/82 | HR 92 | Temp 98.4°F | Resp 16 | Ht 60.0 in | Wt 130.0 lb

## 2020-11-10 DIAGNOSIS — E7800 Pure hypercholesterolemia, unspecified: Secondary | ICD-10-CM | POA: Diagnosis not present

## 2020-11-10 DIAGNOSIS — Z0001 Encounter for general adult medical examination with abnormal findings: Secondary | ICD-10-CM

## 2020-11-10 DIAGNOSIS — M85869 Other specified disorders of bone density and structure, unspecified lower leg: Secondary | ICD-10-CM | POA: Diagnosis not present

## 2020-11-10 DIAGNOSIS — F419 Anxiety disorder, unspecified: Secondary | ICD-10-CM

## 2020-11-10 DIAGNOSIS — Z0000 Encounter for general adult medical examination without abnormal findings: Secondary | ICD-10-CM

## 2020-11-10 DIAGNOSIS — I6521 Occlusion and stenosis of right carotid artery: Secondary | ICD-10-CM

## 2020-11-10 DIAGNOSIS — I6529 Occlusion and stenosis of unspecified carotid artery: Secondary | ICD-10-CM

## 2020-11-10 NOTE — Progress Notes (Signed)
Subjective:    Patient ID: Melissa Owen, female    DOB: 1946-05-24, 75 y.o.   MRN: [id number]  HPI Subjective:   Patient presents for Medicare Annual/Subsequent preventive examination.   Patient seems to be doing extremely well.  She has a known right-sided carotid artery stenosis.  She has an appointment to see the vascular surgeon to monitor this later this summer.  I can barely appreciate her carotid bruit today on exam and she denies any symptoms of TIA or stroke.  She is compliant with her aspirin and her statin.  She denies any myalgias or chest pain or shortness of breath.  Her blood pressure is mildly elevated however her heart rate is over 100 and she feels that she is anxious.  I certainly think that she has an element of whitecoat syndrome.  She checked her blood pressure this morning and her systolic number was in the 120s.  Her diastolic number was in the 70s.  I have simply asked her to monitor this more closely so that we can adjust her blood pressure medicine if necessary.  Her immunizations are completely up-to-date.  She is already scheduled her mammogram for September.  Her colonoscopy is scheduled for later this year.  Therefore her preventative care is up-to-date.  She denies any falls, depression, or memory loss.  She is due for a bone density.  Her last bone density showed a T score of -2.0 in the left femur.  She has not had any imaging since that time.  She is taking vitamin D.  She has been avoiding calcium due to upset stomach and IBS Immunization History  Administered Date(s) Administered  . Influenza Whole 03/10/2009  . Influenza, High Dose Seasonal PF 02/25/2016, 03/12/2018, 01/30/2019  . Influenza,inj,Quad PF,6+ Mos 02/16/2013, 03/14/2014, 05/02/2015  . Influenza-Unspecified 03/10/2017  . PFIZER(Purple Top)SARS-COV-2 Vaccination 06/30/2019, 07/21/2019  . Pneumococcal Conjugate-13 03/14/2014  . Pneumococcal Polysaccharide-23 03/30/2009, 10/06/2019  . Tdap  12/19/2008  . Zoster Recombinat (Shingrix) 10/08/2016, 12/08/2016  . Zoster, Live 12/19/2008    Review Past Medical/Family/Social: Past Medical History:  Diagnosis Date  . Anxiety   . Carotid stenosis, asymptomatic, right    50-69% (2020)  . Hyperlipidemia   . Hypertension   . IBS (irritable bowel syndrome)   . Mitral valve prolapse   . Osteopenia 06/2015   T score - 2.0  . Urinary incontinence    Past Surgical History:  Procedure Laterality Date  . ABDOMINAL HYSTERECTOMY  age 71   Leiomyomata  . carpal tunnel repair    . TUBAL LIGATION     Current Outpatient Medications on File Prior to Visit  Medication Sig Dispense Refill  . ALPRAZolam (XANAX) 0.25 MG tablet TAKE 1 TABLET(0.25 MG) BY MOUTH THREE TIMES DAILY AS NEEDED 90 tablet 0  . aspirin EC 81 MG tablet Take 81 mg by mouth daily.    Marland Kitchen atorvastatin (LIPITOR) 20 MG tablet TAKE 1 TABLET(20 MG) BY MOUTH DAILY 90 tablet 3  . Cholecalciferol (VITAMIN D PO) Take by mouth.    . estradiol (ESTRACE) 0.5 MG tablet TAKE 1 TABLET(0.5 MG) BY MOUTH DAILY 90 tablet 3  . famotidine (PEPCID) 20 MG tablet Take 20 mg by mouth 2 (two) times daily.    Marland Kitchen losartan (COZAAR) 50 MG tablet TAKE 1 TABLET(50 MG) BY MOUTH DAILY 90 tablet 3  . meloxicam (MOBIC) 15 MG tablet TAKE 1 TABLET(15 MG) BY MOUTH DAILY 90 tablet 0  . NONFORMULARY OR COMPOUNDED ITEM Estradiol vaginal cream 0.02%  insert twice weekly vaginally 90 each 3  . Probiotic Product (PROBIOTIC PO) Take by mouth. (Patient not taking: Reported on 07/21/2020)    . Wheat Dextrin (BENEFIBER PO) Take by mouth.     No current facility-administered medications on file prior to visit.   Allergies  Allergen Reactions  . Aspirin Nausea And Vomiting  . Latex Itching   Social History   Socioeconomic History  . Marital status: Divorced    Spouse name: Not on file  . Number of children: Not on file  . Years of education: Not on file  . Highest education level: Not on file  Occupational History   . Not on file  Tobacco Use  . Smoking status: Never Smoker  . Smokeless tobacco: Never Used  Vaping Use  . Vaping Use: Never used  Substance and Sexual Activity  . Alcohol use: Never    Alcohol/week: 0.0 standard drinks  . Drug use: No  . Sexual activity: Not Currently    Comment: 1st intercourse 75 yo-Fewer than 5 partners  Other Topics Concern  . Not on file  Social History Narrative  . Not on file   Social Determinants of Health   Financial Resource Strain: Not on file  Food Insecurity: Not on file  Transportation Needs: Not on file  Physical Activity: Not on file  Stress: Not on file  Social Connections: Not on file  Intimate Partner Violence: Not on file   Family History  Problem Relation Age of Onset  . Dementia Mother   . Heart disease Mother 71  . Heart disease Father   . Cancer Maternal Grandmother        colon cancer    Review of Systems  All other systems reviewed and are negative.      Objective:   Physical Exam Vitals reviewed.  Constitutional:      General: She is not in acute distress.    Appearance: She is well-developed. She is not diaphoretic.  HENT:     Head: Normocephalic and atraumatic.     Right Ear: External ear normal.     Left Ear: External ear normal.     Nose: Nose normal.     Mouth/Throat:     Pharynx: No oropharyngeal exudate.  Eyes:     General: No scleral icterus.       Right eye: No discharge.        Left eye: No discharge.     Conjunctiva/sclera: Conjunctivae normal.     Pupils: Pupils are equal, round, and reactive to light.  Neck:     Thyroid: No thyromegaly.     Vascular: No JVD.     Trachea: No tracheal deviation.  Cardiovascular:     Rate and Rhythm: Normal rate and regular rhythm.     Heart sounds: Normal heart sounds. No murmur heard. No friction rub. No gallop.   Pulmonary:     Effort: Pulmonary effort is normal. No respiratory distress.     Breath sounds: Normal breath sounds. No stridor. No wheezing or  rales.  Chest:     Chest wall: No tenderness.  Abdominal:     General: Bowel sounds are normal. There is no distension.     Palpations: Abdomen is soft. There is no mass.     Tenderness: There is no abdominal tenderness. There is no guarding or rebound.  Musculoskeletal:        General: No tenderness. Normal range of motion.     Cervical back: Normal  range of motion and neck supple.  Lymphadenopathy:     Cervical: No cervical adenopathy.  Skin:    General: Skin is warm.     Coloration: Skin is not pale.     Findings: No erythema or rash.  Neurological:     Mental Status: She is alert and oriented to person, place, and time.     Cranial Nerves: No cranial nerve deficit.     Motor: No abnormal muscle tone.     Coordination: Coordination normal.     Deep Tendon Reflexes: Reflexes are normal and symmetric.  Psychiatric:        Behavior: Behavior normal.        Thought Content: Thought content normal.        Judgment: Judgment normal.         Assessment & Plan:  Osteopenia of lower leg, unspecified laterality - Plan: DG Bone Density  Stenosis of carotid artery, unspecified laterality - Plan: CBC with Differential/Platelet, COMPLETE METABOLIC PANEL WITH GFR, Lipid panel  Routine general medical examination at a health care facility  Anxiety  Pure hypercholesterolemia  Carotid stenosis, asymptomatic, right  Due to the patient's history of osteopenia, I will repeat a bone density.  She is already taking vitamin D but I did encourage her to try to take 2 calcium rich Tums tablets per day in an effort to try to get more than 1000 mg of calcium.  Patient is already scheduled a follow-up with her vascular surgeon to monitor her carotid artery stenosis.  I will check a CMP and a lipid panel.  Ideally I like her LDL cholesterol below 70.  She is compliant taking her aspirin.  She is already scheduled her colonoscopy with her gastroenterologist.  Her mammogram is scheduled for September.   Therefore the remainder of her preventative care is up-to-date.  Her immunizations are up-to-date except for tetanus shot but together we decided to defer that.  The only abnormality on her exam is her blood pressure but I believe a lot of this is whitecoat syndrome.  Of asked her to monitor this more closely and notify me if greater than 140/90.  She denies any falls, memory loss, or depression

## 2020-11-11 LAB — CBC WITH DIFFERENTIAL/PLATELET
Absolute Monocytes: 381 cells/uL (ref 200–950)
Basophils Absolute: 48 cells/uL (ref 0–200)
Basophils Relative: 0.7 %
Eosinophils Absolute: 102 cells/uL (ref 15–500)
Eosinophils Relative: 1.5 %
HCT: 41.9 % (ref 35.0–45.0)
Hemoglobin: 13.5 g/dL (ref 11.7–15.5)
Lymphs Abs: 2149 cells/uL (ref 850–3900)
MCH: 30.4 pg (ref 27.0–33.0)
MCHC: 32.2 g/dL (ref 32.0–36.0)
MCV: 94.4 fL (ref 80.0–100.0)
MPV: 11.4 fL (ref 7.5–12.5)
Monocytes Relative: 5.6 %
Neutro Abs: 4121 cells/uL (ref 1500–7800)
Neutrophils Relative %: 60.6 %
Platelets: 228 10*3/uL (ref 140–400)
RBC: 4.44 10*6/uL (ref 3.80–5.10)
RDW: 12 % (ref 11.0–15.0)
Total Lymphocyte: 31.6 %
WBC: 6.8 10*3/uL (ref 3.8–10.8)

## 2020-11-11 LAB — COMPLETE METABOLIC PANEL WITH GFR
AG Ratio: 1.7 (calc) (ref 1.0–2.5)
ALT: 14 U/L (ref 6–29)
AST: 13 U/L (ref 10–35)
Albumin: 4.4 g/dL (ref 3.6–5.1)
Alkaline phosphatase (APISO): 66 U/L (ref 37–153)
BUN: 12 mg/dL (ref 7–25)
CO2: 29 mmol/L (ref 20–32)
Calcium: 9.6 mg/dL (ref 8.6–10.4)
Chloride: 105 mmol/L (ref 98–110)
Creat: 0.68 mg/dL (ref 0.60–0.93)
GFR, Est African American: 99 mL/min/{1.73_m2} (ref >=60)
GFR, Est Non African American: 86 mL/min/{1.73_m2} (ref >=60)
Globulin: 2.6 g/dL (calc) (ref 1.9–3.7)
Glucose, Bld: 90 mg/dL (ref 65–99)
Potassium: 4.6 mmol/L (ref 3.5–5.3)
Sodium: 142 mmol/L (ref 135–146)
Total Bilirubin: 0.6 mg/dL (ref 0.2–1.2)
Total Protein: 7 g/dL (ref 6.1–8.1)

## 2020-11-11 LAB — LIPID PANEL
Cholesterol: 170 mg/dL (ref <200)
HDL: 91 mg/dL (ref >=50)
LDL Cholesterol (Calc): 62 mg/dL (calc)
Non-HDL Cholesterol (Calc): 79 mg/dL (calc) (ref <130)
Total CHOL/HDL Ratio: 1.9 (calc) (ref <5.0)
Triglycerides: 85 mg/dL (ref <150)

## 2020-11-14 ENCOUNTER — Encounter: Payer: Self-pay | Admitting: *Deleted

## 2020-11-14 DIAGNOSIS — K5900 Constipation, unspecified: Secondary | ICD-10-CM | POA: Insufficient documentation

## 2020-11-14 DIAGNOSIS — K589 Irritable bowel syndrome without diarrhea: Secondary | ICD-10-CM | POA: Insufficient documentation

## 2020-11-14 DIAGNOSIS — K219 Gastro-esophageal reflux disease without esophagitis: Secondary | ICD-10-CM | POA: Insufficient documentation

## 2020-11-15 ENCOUNTER — Ambulatory Visit (INDEPENDENT_AMBULATORY_CARE_PROVIDER_SITE_OTHER): Payer: Medicare Other | Admitting: Physician Assistant

## 2020-11-15 ENCOUNTER — Other Ambulatory Visit: Payer: Self-pay

## 2020-11-15 ENCOUNTER — Ambulatory Visit (HOSPITAL_COMMUNITY)
Admission: RE | Admit: 2020-11-15 | Discharge: 2020-11-15 | Disposition: A | Discharge status: Home / Self Care | Payer: Medicare Other | Source: Ambulatory Visit | Attending: Vascular Surgery | Admitting: Vascular Surgery

## 2020-11-15 VITALS — BP 138/81 | HR 94 | Temp 98.1°F | Resp 20 | Ht 60.0 in | Wt 129.7 lb

## 2020-11-15 DIAGNOSIS — I6523 Occlusion and stenosis of bilateral carotid arteries: Secondary | ICD-10-CM

## 2020-11-15 DIAGNOSIS — I6521 Occlusion and stenosis of right carotid artery: Secondary | ICD-10-CM | POA: Diagnosis not present

## 2020-11-15 DIAGNOSIS — I6529 Occlusion and stenosis of unspecified carotid artery: Secondary | ICD-10-CM

## 2020-11-15 NOTE — Progress Notes (Signed)
Carotid Artery Follow-Up   VASCULAR SURGERY ASSESSMENT & PLAN:   Melissa Owen is a 75 y.o. female presents for routine surveillance. Bilateral carotid artery stenosis: The patient has no symptoms referable to carotid artery stenosis.  Duplex examination today is stable as compared to 1 year ago.  The right mid ICA stenosis seen 1 year ago is not demonstrated today.  Bilateral carotid ICA stenosis estimated at 1 to 39%. We reviewed the signs and symptoms of stroke/TIA and advised the patient to call EMS should these occur.    Continue optimal medical management of hypertension and follow-up with primary care physician. Continue the following medications: Aspirin and statin Follow-up in 1 year with carotid duplex ultrasound.  SUBJECTIVE:   The patient denies monocular blindness, slurred speech, facial drooping, extremity weakness or numbness.  PHYSICAL EXAM:   Vitals:   11/15/20 1433 11/15/20 1435  BP: (!) 142/89 138/81  Pulse: 94   Resp: 20   Temp: 98.1 F (36.7 C)   TempSrc: Temporal   SpO2: 97%   Weight: 129 lb 11.2 oz (58.8 kg)   Height: 5' (1.524 m)     General appearance: Well-developed, well-nourished in no apparent distress Neurologic: Alert and oriented x4, tongue is midline, face symmetric, speech fluent, 5 out of 5 bilateral upper extremity grip strength, triceps and biceps strength Cardiovascular: Heart rate and rhythm are regular.  2+ bilateral radial pulses are palpable.  No carotid bruits. Respirations: Nonlabored   NON-INVASIVE VASCULAR STUDIES   11/15/2020 Right Carotid: Velocities in the right ICA are consistent with a 1-39% stenosis.   Left Carotid: Velocities in the left ICA are consistent with a 1-39% stenosis.   Vertebrals: Bilateral vertebral arteries demonstrate antegrade flow.  Subclavians: Normal flow hemodynamics were seen in bilateral subclavian arteries.   PROBLEM LIST:    The patient's past medical history, past surgical history, family  history, social history, allergy list and medication list are reviewed.   CURRENT MEDS:    Current Outpatient Medications:  .  ALPRAZolam (XANAX) 0.25 MG tablet, TAKE 1 TABLET(0.25 MG) BY MOUTH THREE TIMES DAILY AS NEEDED, Disp: 90 tablet, Rfl: 0 .  aspirin EC 81 MG tablet, Take 81 mg by mouth daily., Disp: , Rfl:  .  atorvastatin (LIPITOR) 20 MG tablet, TAKE 1 TABLET(20 MG) BY MOUTH DAILY, Disp: 90 tablet, Rfl: 3 .  Cholecalciferol (VITAMIN D PO), Take by mouth., Disp: , Rfl:  .  estradiol (ESTRACE) 0.5 MG tablet, TAKE 1 TABLET(0.5 MG) BY MOUTH DAILY, Disp: 90 tablet, Rfl: 3 .  famotidine (PEPCID) 20 MG tablet, Take 20 mg by mouth 2 (two) times daily., Disp: , Rfl:  .  glycopyrrolate (ROBINUL) 2 MG tablet, Take 2 mg by mouth 2 (two) times daily as needed., Disp: , Rfl:  .  losartan (COZAAR) 50 MG tablet, TAKE 1 TABLET(50 MG) BY MOUTH DAILY, Disp: 90 tablet, Rfl: 3 .  meloxicam (MOBIC) 15 MG tablet, TAKE 1 TABLET(15 MG) BY MOUTH DAILY, Disp: 90 tablet, Rfl: 0 .  NONFORMULARY OR COMPOUNDED ITEM, Estradiol vaginal cream 0.02% insert twice weekly vaginally, Disp: 90 each, Rfl: 3 .  Probiotic Product (PROBIOTIC PO), Take by mouth., Disp: , Rfl:  .  Wheat Dextrin (BENEFIBER PO), Take by mouth., Disp: , Rfl:    REVIEW OF SYSTEMS:   [X]  denotes positive finding, [ ]  denotes negative finding Cardiac  Comments:  Chest pain or chest pressure:    Shortness of breath upon exertion:    Short of breath when lying  flat:    Irregular heart rhythm:        Vascular    Pain in calf, thigh, or hip brought on by ambulation:    Pain in feet at night that wakes you up from your sleep:     Blood clot in your veins:    Leg swelling:         Pulmonary    Oxygen at home:    Productive cough:     Wheezing:         Neurologic    Sudden weakness in arms or legs:     Sudden numbness in arms or legs:     Sudden onset of difficulty speaking or slurred speech:    Temporary loss of vision in one eye:      Problems with dizziness:         Gastrointestinal    Blood in stool:     Vomited blood:         Genitourinary    Burning when urinating:     Blood in urine:        Psychiatric    Major depression:         Hematologic    Bleeding problems:    Problems with blood clotting too easily:        Skin    Rashes or ulcers:        Constitutional    Fever or chills:     Barbie Banner, PA-C  Office: [phone] 11/15/2020

## 2020-11-21 ENCOUNTER — Other Ambulatory Visit: Payer: Self-pay | Admitting: Family Medicine

## 2020-11-30 ENCOUNTER — Other Ambulatory Visit: Payer: Self-pay | Admitting: Family Medicine

## 2020-11-30 ENCOUNTER — Other Ambulatory Visit: Payer: Self-pay | Admitting: *Deleted

## 2020-11-30 MED ORDER — ALPRAZOLAM 0.25 MG PO TABS: 0.2500 mg | ORAL_TABLET | Freq: Three times a day (TID) | ORAL | 3 refills | Status: DC | PRN

## 2020-11-30 NOTE — Telephone Encounter (Signed)
Received call from patient.   Requested refill on Xanax.   Ok to refill??  Last office visit 11/11/2019.  Last refill 10/30/2020.  Ok to add refills to prescription?

## 2020-12-07 DIAGNOSIS — Z1211 Encounter for screening for malignant neoplasm of colon: Secondary | ICD-10-CM | POA: Diagnosis not present

## 2020-12-07 DIAGNOSIS — Q399 Congenital malformation of esophagus, unspecified: Secondary | ICD-10-CM | POA: Diagnosis not present

## 2020-12-07 DIAGNOSIS — K449 Diaphragmatic hernia without obstruction or gangrene: Secondary | ICD-10-CM | POA: Diagnosis not present

## 2020-12-07 DIAGNOSIS — K269 Duodenal ulcer, unspecified as acute or chronic, without hemorrhage or perforation: Secondary | ICD-10-CM | POA: Diagnosis not present

## 2020-12-07 DIAGNOSIS — D120 Benign neoplasm of cecum: Secondary | ICD-10-CM | POA: Diagnosis not present

## 2020-12-07 DIAGNOSIS — R1013 Epigastric pain: Secondary | ICD-10-CM | POA: Diagnosis not present

## 2020-12-07 DIAGNOSIS — K644 Residual hemorrhoidal skin tags: Secondary | ICD-10-CM | POA: Diagnosis not present

## 2020-12-07 DIAGNOSIS — D122 Benign neoplasm of ascending colon: Secondary | ICD-10-CM | POA: Diagnosis not present

## 2020-12-07 DIAGNOSIS — K3189 Other diseases of stomach and duodenum: Secondary | ICD-10-CM | POA: Diagnosis not present

## 2020-12-07 DIAGNOSIS — K2930 Chronic superficial gastritis without bleeding: Secondary | ICD-10-CM | POA: Diagnosis not present

## 2020-12-07 DIAGNOSIS — K5730 Diverticulosis of large intestine without perforation or abscess without bleeding: Secondary | ICD-10-CM | POA: Diagnosis not present

## 2020-12-12 DIAGNOSIS — K2930 Chronic superficial gastritis without bleeding: Secondary | ICD-10-CM | POA: Diagnosis not present

## 2020-12-12 DIAGNOSIS — D122 Benign neoplasm of ascending colon: Secondary | ICD-10-CM | POA: Diagnosis not present

## 2020-12-12 DIAGNOSIS — D120 Benign neoplasm of cecum: Secondary | ICD-10-CM | POA: Diagnosis not present

## 2021-02-19 DIAGNOSIS — Z23 Encounter for immunization: Secondary | ICD-10-CM | POA: Diagnosis not present

## 2021-02-20 ENCOUNTER — Other Ambulatory Visit: Payer: Self-pay | Admitting: Family Medicine

## 2021-02-20 NOTE — Telephone Encounter (Signed)
Last ov 11/10/20   Last refill was 11/21/20 qty 90

## 2021-02-22 DIAGNOSIS — H2513 Age-related nuclear cataract, bilateral: Secondary | ICD-10-CM | POA: Diagnosis not present

## 2021-02-22 DIAGNOSIS — H524 Presbyopia: Secondary | ICD-10-CM | POA: Diagnosis not present

## 2021-03-21 ENCOUNTER — Telehealth: Payer: Self-pay | Admitting: *Deleted

## 2021-03-21 MED ORDER — NIRMATRELVIR/RITONAVIR (PAXLOVID)TABLET: 3.0000 | ORAL_TABLET | Freq: Two times a day (BID) | ORAL | 0 refills | Status: AC

## 2021-03-21 NOTE — Telephone Encounter (Signed)
Patient tested positive for COVID on 03/20/2021.   Sx began 03/20/2021 and include cough, body aches, and fatigue.  Advised to continue symptom management with OTC medications: Tylenol/ Ibuprofen for fever/ body aches, Mucinex/ Delsym for cough/ chest congestion, Afrin/Sudafed/nasal saline for sinus pressure/ nasal congestion.  GFR 86. Order for Paxlovid sent to pharmacy. Advised possible SE include nausea, diarrhea, loss of taste and hypertension.  If chest pain, shortness of breath, fever >104 that is unresponsive to antipyretics noted, or if unable to tolerate fluids, advised to go to ER for evaluation.   Advised that the CDC recommends the following criteria prior to ending isolation in vaccinated and unvaccinated persons: at least 5 days since symptoms onset, then an additional 5 days of masking  AND 3 days fever free without antipyretics (Tylenol or Ibuprofen) AND improvement in respiratory symptoms.

## 2021-04-01 ENCOUNTER — Other Ambulatory Visit: Payer: Self-pay | Admitting: Family Medicine

## 2021-04-02 DIAGNOSIS — Z20822 Contact with and (suspected) exposure to covid-19: Secondary | ICD-10-CM | POA: Diagnosis not present

## 2021-04-02 NOTE — Telephone Encounter (Signed)
Ok to refill??  Last office visit 11/10/2020.  Last refill 11/30/2020, #3 refills.

## 2021-04-21 DIAGNOSIS — Z1231 Encounter for screening mammogram for malignant neoplasm of breast: Secondary | ICD-10-CM | POA: Diagnosis not present

## 2021-04-21 LAB — HM MAMMOGRAPHY

## 2021-04-25 ENCOUNTER — Encounter: Payer: Self-pay | Admitting: Obstetrics and Gynecology

## 2021-04-27 ENCOUNTER — Encounter: Payer: Self-pay | Admitting: *Deleted

## 2021-05-16 ENCOUNTER — Ambulatory Visit
Admission: RE | Admit: 2021-05-16 | Discharge: 2021-05-16 | Disposition: A | Discharge status: Home / Self Care | Payer: Medicare Other | Source: Ambulatory Visit | Attending: Family Medicine | Admitting: Family Medicine

## 2021-05-16 DIAGNOSIS — M85869 Other specified disorders of bone density and structure, unspecified lower leg: Secondary | ICD-10-CM

## 2021-05-16 DIAGNOSIS — M85832 Other specified disorders of bone density and structure, left forearm: Secondary | ICD-10-CM | POA: Diagnosis not present

## 2021-05-16 DIAGNOSIS — M810 Age-related osteoporosis without current pathological fracture: Secondary | ICD-10-CM | POA: Diagnosis not present

## 2021-05-18 ENCOUNTER — Other Ambulatory Visit: Payer: Self-pay

## 2021-05-18 MED ORDER — ALENDRONATE SODIUM 70 MG PO TABS: 70.0000 mg | ORAL_TABLET | ORAL | 11 refills | Status: DC

## 2021-05-18 NOTE — Progress Notes (Signed)
o

## 2021-05-26 ENCOUNTER — Other Ambulatory Visit: Payer: Self-pay | Admitting: Family Medicine

## 2021-07-17 NOTE — Progress Notes (Signed)
76 y.o. [id number] Divorced White or Caucasian Not Hispanic or Latino female here for breast and pelvic exam.  H/O hysterectomy.  She is on oral ERT. She is taking 0.5 mg every other day. When she tried going off of it she got headaches and hot flashes.  She has a h/o carotic artery stenosis. She is on ASA and a statin.   Last year she was noted to have a grade 1-2 cystocele. She says that she feels like her bladder has prolapsed more. She notices when she wipes and washes. She has some lower back pain and rectal pressure. She has mild GSI, small amounts a couple of times a day.   She has IBS-C, she can go up to 2 weeks without a BM. With benefiber BID she can have a BM every other day. Has to strain, some rectal bleeding, has a h/o hemorrhoids. Colonoscopy in 6/22, 2 polyps.   Her primary started her on Fosamax in 12/2 after her DEXA returned with osteoporosis. She is no longer taking the Fosamax she states it it upsets her stomach and IBS.    No LMP recorded. Patient has had a hysterectomy.          Sexually active: No. Not for 3 years.  The current method of family planning is status post hysterectomy.    Exercising: Yes.     Walking  Smoker:  no  Health Maintenance: Pap:  07/24/10 WNL  History of abnormal Pap:  no MMG:  04/25/21  Bi-rads 1 neg  BMD:   05/16/21 osteoporotic  Colonoscopy: December 07, 2020 she had 2 polyps, f/u in 5 years.    TDaP:  12/19/08  is aware its out of date.  Gardasil: n/a   reports that she has never smoked. She has never used smokeless tobacco. She reports that she does not drink alcohol and does not use drugs. 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 Loves her job.  Has twin boys, they are 45. She has an 15 year old grandson.   Past Medical History:  Diagnosis Date   Anxiety    Carotid stenosis, asymptomatic, right    50-69% (2020)   Hyperlipidemia    Hypertension    IBS (irritable bowel syndrome)    Mitral valve prolapse    Osteopenia 06/2015   T score  - 2.0   Urinary incontinence     Past Surgical History:  Procedure Laterality Date   ABDOMINAL HYSTERECTOMY  age 69   Leiomyomata   carpal tunnel repair     TUBAL LIGATION      Current Outpatient Medications  Medication Sig Dispense Refill   ALPRAZolam (XANAX) 0.25 MG tablet TAKE 1 TABLET(0.25 MG) BY MOUTH THREE TIMES DAILY AS NEEDED FOR ANXIETY 90 tablet 3   aspirin EC 81 MG tablet Take 81 mg by mouth daily.     atorvastatin (LIPITOR) 20 MG tablet TAKE 1 TABLET(20 MG) BY MOUTH DAILY 90 tablet 3   Cholecalciferol (VITAMIN D PO) Take by mouth.     estradiol (ESTRACE) 0.5 MG tablet TAKE 1 TABLET(0.5 MG) BY MOUTH DAILY 90 tablet 3   famotidine (PEPCID) 20 MG tablet Take 20 mg by mouth 2 (two) times daily.     losartan (COZAAR) 50 MG tablet TAKE 1 TABLET(50 MG) BY MOUTH DAILY 90 tablet 3   meloxicam (MOBIC) 15 MG tablet TAKE 1 TABLET(15 MG) BY MOUTH DAILY 90 tablet 0   NONFORMULARY OR COMPOUNDED ITEM Estradiol vaginal cream 0.02% insert twice  weekly vaginally 90 each 3   Probiotic Product (PROBIOTIC PO) Take by mouth.     Wheat Dextrin (BENEFIBER PO) Take by mouth.     No current facility-administered medications for this visit.    Family History  Problem Relation Age of Onset   Dementia Mother    Heart disease Mother 47   Heart disease Father    Cancer Maternal Grandmother        colon cancer    Review of Systems  Gastrointestinal:  Positive for anal bleeding and constipation.  All other systems reviewed and are negative.  Exam:   BP 134/80    Pulse 86    Ht 5' (1.524 m)    Wt 125 lb 6.4 oz (56.9 kg)    SpO2 99%    BMI 24.49 kg/m   Weight change: @WEIGHTCHANGE @ Height:   Height: 5' (152.4 cm)  Ht Readings from Last 3 Encounters:  07/25/21 5' (1.524 m)  11/15/20 5' (1.524 m)  11/10/20 5' (1.524 m)    General appearance: alert, cooperative and appears stated age Head: Normocephalic, without obvious abnormality, atraumatic Neck: no adenopathy, supple, symmetrical,  trachea midline and thyroid normal to inspection and palpation Breasts: normal appearance, no masses or tenderness Abdomen: soft, non-tender; non distended,  no masses,  no organomegaly Extremities: extremities normal, atraumatic, no cyanosis or edema Skin: Skin color, texture, turgor normal. No rashes or lesions Lymph nodes: Cervical, supraclavicular, and axillary nodes normal. No abnormal inguinal nodes palpated Neurologic: Grossly normal   Pelvic: External genitalia:  no lesions              Urethra:  normal appearing urethra with no masses, tenderness or lesions              Bartholins and Skenes: normal                 Vagina: atrophic appearing vagina with a spotty ecchymotic rash, slight amount of thin white vaginal d/c              Cervix: absent               Bimanual Exam:  Uterus:  uterus absent              Adnexa: no mass, fullness, tenderness               Rectovaginal: Confirms               Anus:  normal sphincter tone, no lesions  Gae Dry chaperoned for the exam.   1. Gynecologic exam normal Discussed breast self exam Discussed calcium and vit D intake No pap needed Mammogram and colonoscopy are UTD  2. Encounter for monitoring postmenopausal estrogen replacement therapy Recommended she wean off of ERT. Discussed increased risk of blood clot, stroke, MI and breast cancer. She has a h/o HTN, elevated lipids and carotid artery stenosis, I think for her the risks of staying on ERT are increased - estradiol (ESTRACE) 0.5 MG tablet; Take 1/2 a tablet every other day for the next month, then try to stop.  Dispense: 30 tablet; Refill: 1 -if she is having trouble weaning off of ERT, would recommend that she switch to the low dose patch, this will decrease the risk of clots compared to the oral estrogen.  3. Cystocele, midline Not very marked on exam today. If it is bothering her, recommend that she return at the end of a busy day for a repeat exam. Discussed options  for managing cystoceles are do nothing, pessary and surgery.   4. Age-related osteoporosis without current pathological fracture Will f/u with her primary  5. Vaginal burning spotty ecchymotic rash - WET PREP FOR TRICH, YEAST, CLUE - SureSwab Advanced Vaginitis, TMA  6. Subacute vaginitis - SureSwab Advanced Vaginitis, TMA

## 2021-07-25 ENCOUNTER — Ambulatory Visit (INDEPENDENT_AMBULATORY_CARE_PROVIDER_SITE_OTHER): Payer: Medicare Other | Admitting: Obstetrics and Gynecology

## 2021-07-25 ENCOUNTER — Other Ambulatory Visit: Payer: Self-pay

## 2021-07-25 ENCOUNTER — Encounter: Payer: Self-pay | Admitting: Obstetrics and Gynecology

## 2021-07-25 VITALS — BP 134/80 | HR 86 | Ht 60.0 in | Wt 125.4 lb

## 2021-07-25 DIAGNOSIS — N949 Unspecified condition associated with female genital organs and menstrual cycle: Secondary | ICD-10-CM | POA: Diagnosis not present

## 2021-07-25 DIAGNOSIS — N8111 Cystocele, midline: Secondary | ICD-10-CM

## 2021-07-25 DIAGNOSIS — Z01419 Encounter for gynecological examination (general) (routine) without abnormal findings: Secondary | ICD-10-CM | POA: Diagnosis not present

## 2021-07-25 DIAGNOSIS — Z79890 Hormone replacement therapy (postmenopausal): Secondary | ICD-10-CM | POA: Diagnosis not present

## 2021-07-25 DIAGNOSIS — Z5181 Encounter for therapeutic drug level monitoring: Secondary | ICD-10-CM | POA: Diagnosis not present

## 2021-07-25 DIAGNOSIS — N761 Subacute and chronic vaginitis: Secondary | ICD-10-CM | POA: Diagnosis not present

## 2021-07-25 DIAGNOSIS — L292 Pruritus vulvae: Secondary | ICD-10-CM | POA: Diagnosis not present

## 2021-07-25 DIAGNOSIS — M810 Age-related osteoporosis without current pathological fracture: Secondary | ICD-10-CM

## 2021-07-25 LAB — WET PREP FOR TRICH, YEAST, CLUE

## 2021-07-25 MED ORDER — ESTRADIOL 0.5 MG PO TABS: ORAL_TABLET | ORAL | 1 refills | Status: DC

## 2021-07-25 NOTE — Patient Instructions (Signed)
Kegel Exercises Kegel exercises can help strengthen your pelvic floor muscles. The pelvic floor is a group of muscles that support your rectum, small intestine, and bladder. In females, pelvic floor muscles also help support the uterus. These muscles help you control the flow of urine and stool (feces). Kegel exercises are painless and simple. They do not require any equipment. Your provider may suggest Kegel exercises to: Improve bladder and bowel control. Improve sexual response. Improve weak pelvic floor muscles after surgery to remove the uterus (hysterectomy) or after pregnancy, in females. Improve weak pelvic floor muscles after prostate gland removal or surgery, in males. Kegel exercises involve squeezing your pelvic floor muscles. These are the same muscles you squeeze when you try to stop the flow of urine or keep from passing gas. The exercises can be done while sitting, standing, or lying down, but it is best to vary your position. Ask your health care provider which exercises are safe for you. Do exercises exactly as told by your health care provider and adjust them as directed. Do not begin these exercises until told by your health care provider. Exercises How to do Kegel exercises: Squeeze your pelvic floor muscles tight. You should feel a tight lift in your rectal area. If you are a female, you should also feel a tightness in your vaginal area. Keep your stomach, buttocks, and legs relaxed. Hold the muscles tight for up to 10 seconds. Breathe normally. Relax your muscles for up to 10 seconds. Repeat as told by your health care provider. Repeat this exercise daily as told by your health care provider. Continue to do this exercise for at least 4-6 weeks, or for as long as told by your health care provider. You may be referred to a physical therapist who can help you learn more about how to do Kegel exercises. Depending on your condition, your health care provider may  recommend: Varying how long you squeeze your muscles. Doing several sets of exercises every day. Doing exercises for several weeks. Making Kegel exercises a part of your regular exercise routine. This information is not intended to replace advice given to you by your health care provider. Make sure you discuss any questions you have with your health care provider. Document Revised: 10/05/2020 Document Reviewed: 10/05/2020 Elsevier Patient Education  [place]. About Cystocele  Overview  The pelvic organs, including the bladder, are normally supported by pelvic floor muscles and ligaments.  When these muscles and ligaments are stretched, weakened or torn, the wall between the bladder and the vagina sags or herniates causing a prolapse, sometimes called a cystocele.  This condition may cause discomfort and problems with emptying the bladder.  It can be present in various stages.  Some people are not aware of the changes.  Others may notice changes at the vaginal opening or a feeling of the bladder dropping outside the body.  Causes of a Cystocele  A cystocele is usually caused by muscle straining or stretching during childbirth.  In addition, cystocele is more common after menopause, because the hormone estrogen helps keep the elastic tissues around the pelvic organs strong.  A cystocele is more likely to occur when levels of estrogen decrease.  Other causes include: heavy lifting, chronic coughing, previous pelvic surgery and obesity.  Symptoms  A bladder that has dropped from its normal position may cause: unwanted urine leakage (stress incontinence), frequent urination or urge to urinate, incomplete emptying of the bladder (not feeling bladder relief after emptying), pain or discomfort  in the vagina, pelvis, groin, lower back or lower abdomen and frequent urinary tract infections.  Mild cases may not cause any symptoms.  Treatment Options Pelvic floor (Kegel) exercises:  Strength  training the muscles in your genital area Behavioral changes: Treating and preventing constipation, taking time to empty your bladder properly, learning to lift properly and/or avoid heavy lifting when possible, stopping smoking, avoiding weight gain and treating a chronic cough or bronchitis. A pessary: A vaginal support device is sometimes used to help pelvic support caused by muscle and ligament changes. Surgery: Surgical repair may be necessary if symptoms cannot be managed with exercise, behavioral changes and a pessary.  Surgery is usually considered for severe cases.   2007, [employer]

## 2021-07-26 LAB — SURESWAB® ADVANCED VAGINITIS,TMA
CANDIDA SPECIES: NOT DETECTED
Candida glabrata: NOT DETECTED
SURESWAB(R) ADV BACTERIAL VAGINOSIS(BV),TMA: NEGATIVE
TRICHOMONAS VAGINALIS (TV),TMA: NOT DETECTED

## 2021-07-27 ENCOUNTER — Telehealth: Payer: Self-pay | Admitting: *Deleted

## 2021-07-27 ENCOUNTER — Telehealth: Payer: Self-pay

## 2021-07-27 NOTE — Telephone Encounter (Signed)
Patient informed with below results. She would like Rx, she would prefer the compound estradiol 0.02% from [facility]. Patient said this medication is cheaper. Please advise

## 2021-07-27 NOTE — Telephone Encounter (Signed)
Patient called to report she is having side effects with Fosamax and will not be able to continue this medication. It has been removed from her med list.   Please advise if pt needs alternative, thanks!

## 2021-07-27 NOTE — Telephone Encounter (Signed)
-----   Message from Salvadore Dom, MD sent at 07/26/2021  4:36 PM EST ----- Please let the patient know that her vaginitis panel is negative for infection. If the vaginal burning is on going or she is feeling vaginal dryness, we can always start her on vaginal estrogen. She is currently trying to wean off of oral estrogen. The vaginal estrogen is not the same level of risk for her as the oral estrogen. I think it would be fine for her if she feels she needs it.

## 2021-07-31 DIAGNOSIS — K269 Duodenal ulcer, unspecified as acute or chronic, without hemorrhage or perforation: Secondary | ICD-10-CM | POA: Insufficient documentation

## 2021-07-31 DIAGNOSIS — Q399 Congenital malformation of esophagus, unspecified: Secondary | ICD-10-CM | POA: Insufficient documentation

## 2021-07-31 DIAGNOSIS — K2930 Chronic superficial gastritis without bleeding: Secondary | ICD-10-CM | POA: Insufficient documentation

## 2021-07-31 MED ORDER — NONFORMULARY OR COMPOUNDED ITEM: 3 refills | Status: DC

## 2021-07-31 NOTE — Telephone Encounter (Signed)
Rx called in 

## 2021-07-31 NOTE — Telephone Encounter (Signed)
Please call in the equivalent compounded estradiol dose to the estrace vaginal cream. Estrace dose is 1 gram per vaginal 2 x a week at hs. Call in a 3 month supply with 3 refills

## 2021-08-01 NOTE — Telephone Encounter (Signed)
Spoke with patient and she reports she has tried Prolia multiple times and cannot handle that due to the side effects as well. There are notes in her chart from 2015-2017 regarding this. Patient does take Vit D supplements but cannot take daily due to GERD issues. Patient would like to know if there are any other alternative options.  Please advise, thanks!

## 2021-08-02 ENCOUNTER — Other Ambulatory Visit: Payer: Self-pay | Admitting: Family Medicine

## 2021-08-02 NOTE — Telephone Encounter (Signed)
Spoke with patient and advised. She states she is extremely busy at work right now so does not want to pursue this at the moment. She will do some research and let us know if she would like to see about starting Reclast. Nothing further needed at this time.

## 2021-08-02 NOTE — Telephone Encounter (Signed)
LOV 11/10/20 Last refill 04/02/21, #90, 3 refills  Please review, thanks!

## 2021-08-04 DIAGNOSIS — Z20822 Contact with and (suspected) exposure to covid-19: Secondary | ICD-10-CM | POA: Diagnosis not present

## 2021-08-07 ENCOUNTER — Other Ambulatory Visit: Payer: Self-pay

## 2021-08-07 DIAGNOSIS — Z5181 Encounter for therapeutic drug level monitoring: Secondary | ICD-10-CM

## 2021-08-30 ENCOUNTER — Other Ambulatory Visit: Payer: Self-pay | Admitting: Family Medicine

## 2021-08-31 NOTE — Telephone Encounter (Signed)
LOV 11/10/20 ?Last refill 08/02/21, #90, 0 refills ? ?Please review, thanks! ? ?

## 2021-09-12 ENCOUNTER — Other Ambulatory Visit: Payer: Self-pay | Admitting: Family Medicine

## 2021-09-13 ENCOUNTER — Other Ambulatory Visit: Payer: Self-pay

## 2021-10-04 ENCOUNTER — Other Ambulatory Visit: Payer: Self-pay | Admitting: Family Medicine

## 2021-10-04 NOTE — Telephone Encounter (Signed)
LOV 11/10/20 ?Last refill 08/31/21, #90, 0 refills ? ?Please review, thanks! ? ?

## 2021-10-19 DIAGNOSIS — Z23 Encounter for immunization: Secondary | ICD-10-CM | POA: Diagnosis not present

## 2021-11-06 ENCOUNTER — Other Ambulatory Visit: Payer: Self-pay | Admitting: Family Medicine

## 2021-11-06 NOTE — Telephone Encounter (Signed)
Is this okay to refill?  Last filled 10/04/21 Last OV 11/30/20 Next OV 11/23/21

## 2021-11-06 NOTE — Telephone Encounter (Signed)
Last filled on 10/04/21 Last OV 11/10/20 Next 11/23/21

## 2021-11-19 ENCOUNTER — Ambulatory Visit (INDEPENDENT_AMBULATORY_CARE_PROVIDER_SITE_OTHER): Payer: Medicare Other | Admitting: Nurse Practitioner

## 2021-11-19 ENCOUNTER — Encounter: Payer: Self-pay | Admitting: Nurse Practitioner

## 2021-11-19 VITALS — BP 122/78

## 2021-11-19 DIAGNOSIS — N3000 Acute cystitis without hematuria: Secondary | ICD-10-CM | POA: Diagnosis not present

## 2021-11-19 DIAGNOSIS — R300 Dysuria: Secondary | ICD-10-CM | POA: Diagnosis not present

## 2021-11-19 MED ORDER — SULFAMETHOXAZOLE-TRIMETHOPRIM 800-160 MG PO TABS: 1.0000 | ORAL_TABLET | Freq: Two times a day (BID) | ORAL | 0 refills | Status: DC

## 2021-11-19 NOTE — Progress Notes (Signed)
   Acute Office Visit  Subjective:    Patient ID: Melissa Owen, female    DOB: 1945-10-24, 76 y.o.   MRN: [id number]   HPI 76 y.o. presents today for dysuria and pelvic pressure that started 5 days ago.    Review of Systems  Constitutional: Negative.   Gastrointestinal: Negative.   Genitourinary:  Positive for difficulty urinating (Not new, has cystocele), dysuria, pelvic pain (Pressure) and urgency. Negative for decreased urine volume, flank pain and hematuria.       Objective:    Physical Exam Constitutional:      Appearance: Normal appearance.  Abdominal:     Tenderness: There is no abdominal tenderness. There is no right CVA tenderness or left CVA tenderness.   GU: not indicated  BP 122/78  Wt Readings from Last 3 Encounters:  07/25/21 125 lb 6.4 oz (56.9 kg)  11/15/20 129 lb 11.2 oz (58.8 kg)  11/10/20 130 lb (59 kg)        Patient informed chaperone available to be present for breast and/or pelvic exam. Patient has requested no chaperone to be present. Patient has been advised what will be completed during breast and pelvic exam.   UA leukocytes 2+, negative nitrites, negative blood, pH 5.0, yellow/cloudy. Microscopic: wbc 10-20, rbc 0-2, moderate bacteria  Assessment & Plan:   Problem List Items Addressed This Visit   None Visit Diagnoses     Acute cystitis without hematuria    -  Primary   Relevant Medications   sulfamethoxazole-trimethoprim (BACTRIM DS) 800-160 MG tablet   Dysuria       Relevant Orders   Urinalysis,Complete w/RFL Culture      Plan: Acute cystitis - Bactrim 800-160 mg BID x 3 days. Culture pending. Increase water intake.      Tamela Gammon DNP, 3:23 PM 11/19/2021

## 2021-11-21 ENCOUNTER — Other Ambulatory Visit: Payer: Self-pay | Admitting: Nurse Practitioner

## 2021-11-21 ENCOUNTER — Other Ambulatory Visit: Payer: Self-pay

## 2021-11-21 DIAGNOSIS — N760 Acute vaginitis: Secondary | ICD-10-CM

## 2021-11-21 LAB — URINALYSIS, COMPLETE W/RFL CULTURE
Bilirubin Urine: NEGATIVE
Glucose, UA: NEGATIVE
Hyaline Cast: NONE SEEN /LPF
Ketones, ur: NEGATIVE
Nitrites, Initial: NEGATIVE
Protein, ur: NEGATIVE
Specific Gravity, Urine: 1.004 (ref 1.001–1.035)
pH: 5 (ref 5.0–8.0)

## 2021-11-21 LAB — URINE CULTURE
MICRO NUMBER:: 13512909
SPECIMEN QUALITY:: ADEQUATE

## 2021-11-21 LAB — CULTURE INDICATED

## 2021-11-21 MED ORDER — FLUCONAZOLE 150 MG PO TABS: 150.0000 mg | ORAL_TABLET | ORAL | 0 refills | Status: DC

## 2021-11-23 ENCOUNTER — Ambulatory Visit (INDEPENDENT_AMBULATORY_CARE_PROVIDER_SITE_OTHER): Payer: Medicare Other | Admitting: Family Medicine

## 2021-11-23 ENCOUNTER — Telehealth: Payer: Self-pay

## 2021-11-23 ENCOUNTER — Encounter: Payer: Self-pay | Admitting: Family Medicine

## 2021-11-23 VITALS — BP 127/72 | HR 96 | Temp 98.1°F | Ht 60.0 in | Wt 123.3 lb

## 2021-11-23 DIAGNOSIS — I6521 Occlusion and stenosis of right carotid artery: Secondary | ICD-10-CM

## 2021-11-23 DIAGNOSIS — E7800 Pure hypercholesterolemia, unspecified: Secondary | ICD-10-CM

## 2021-11-23 DIAGNOSIS — M810 Age-related osteoporosis without current pathological fracture: Secondary | ICD-10-CM | POA: Diagnosis not present

## 2021-11-23 DIAGNOSIS — Z0000 Encounter for general adult medical examination without abnormal findings: Secondary | ICD-10-CM

## 2021-11-23 NOTE — Telephone Encounter (Signed)
Pls call to make a polia inj appt. Thank you.

## 2021-11-23 NOTE — Progress Notes (Signed)
Subjective:    Patient ID: Melissa Owen, female    DOB: 07/11/1945, 76 y.o.   MRN: [id number]  HPI Subjective:   Patient presents for Medicare Annual/Subsequent preventive examination.   Patient seems to be doing extremely well.  She has a known carotid artery stenosis.  Carotid dopplers last year showed: Right Carotid: Velocities in the right ICA are consistent with a 1-39%  stenosis.   Left Carotid: Velocities in the left ICA are consistent with a 1-39%  stenosis.   Vertebrals:  Bilateral vertebral arteries demonstrate antegrade flow.  Subclavians: Normal flow hemodynamics were seen in bilateral subclavian arteries.   Mammogram is due in 11/23.  DEXA 12/22 revealed osteoporosis with T score -2.5 in right hip.  On fosamax.  However the patient had stopped Fosamax because it irritated her stomach.  She is taking calcium and vitamin D.  She would be willing to try Prolia.  She had a colonoscopy earlier this year.  They did find adenomatous polyps and recommended a repeat colonoscopy in 5 years although that would make the patient 76 years old.  She does not require Pap smear.  Her mammogram is not due until November.  Otherwise she has been doing well with no concerns.  Her immunizations are up-to-date as evidenced below. Immunization History  Administered Date(s) Administered   Influenza Whole 03/10/2009   Influenza, High Dose Seasonal PF 02/25/2016, 03/12/2018, 01/30/2019   Influenza,inj,Quad PF,6+ Mos 02/16/2013, 03/14/2014, 05/02/2015   Influenza-Unspecified 03/10/2017   PFIZER(Purple Top)SARS-COV-2 Vaccination 06/30/2019, 07/21/2019, 03/03/2020, 09/19/2020   Pneumococcal Conjugate-13 03/14/2014   Pneumococcal Polysaccharide-23 03/30/2009, 10/06/2019   Tdap 12/19/2008   Zoster Recombinat (Shingrix) 10/08/2016, 12/08/2016   Zoster, Live 12/19/2008    Review Past Medical/Family/Social: Past Medical History:  Diagnosis Date   Anxiety    Carotid stenosis, asymptomatic, right     50-69% (2020)   Hyperlipidemia    Hypertension    IBS (irritable bowel syndrome)    Mitral valve prolapse    Osteopenia 06/2015   T score - 2.0   Urinary incontinence    Past Surgical History:  Procedure Laterality Date   ABDOMINAL HYSTERECTOMY  age 37   Leiomyomata   carpal tunnel repair     TUBAL LIGATION     Current Outpatient Medications on File Prior to Visit  Medication Sig Dispense Refill   ALPRAZolam (XANAX) 0.25 MG tablet TAKE 1 TABLET(0.25 MG) BY MOUTH THREE TIMES DAILY AS NEEDED FOR ANXIETY 90 tablet 0   ALPRAZolam (XANAX) 0.25 MG tablet TAKE 1 TABLET(0.25 MG) BY MOUTH THREE TIMES DAILY AS NEEDED FOR ANXIETY 90 tablet 0   aspirin EC 81 MG tablet Take 81 mg by mouth daily.     atorvastatin (LIPITOR) 20 MG tablet TAKE 1 TABLET(20 MG) BY MOUTH DAILY 90 tablet 0   Cholecalciferol (VITAMIN D PO) Take by mouth.     estradiol (ESTRACE) 0.5 MG tablet Take 1/2 a tablet every other day for the next month, then try to stop. 30 tablet 1   famotidine (PEPCID) 20 MG tablet Take 20 mg by mouth 2 (two) times daily.     famotidine (PEPCID) 40 MG tablet Take 40 mg by mouth 2 (two) times daily.     fluconazole (DIFLUCAN) 150 MG tablet Take 1 tablet (150 mg total) by mouth every 3 (three) days. 2 tablet 0   losartan (COZAAR) 50 MG tablet TAKE 1 TABLET(50 MG) BY MOUTH DAILY 90 tablet 0   meloxicam (MOBIC) 15 MG tablet TAKE 1  TABLET(15 MG) BY MOUTH DAILY 90 tablet 0   NONFORMULARY OR COMPOUNDED ITEM Estradiol vaginal cream 0.02% insert 1 gram vaginally twice weekly 90 each 3   Probiotic Product (PROBIOTIC PO) Take by mouth.     sulfamethoxazole-trimethoprim (BACTRIM DS) 800-160 MG tablet Take 1 tablet by mouth 2 (two) times daily. 6 tablet 0   Wheat Dextrin (BENEFIBER PO) Take by mouth.     No current facility-administered medications on file prior to visit.   Allergies  Allergen Reactions   Aspirin Nausea And Vomiting   Fosamax [Alendronate]     GI upset   Latex Itching   Social  History   Socioeconomic History   Marital status: Divorced    Spouse name: Not on file   Number of children: Not on file   Years of education: Not on file   Highest education level: Not on file  Occupational History   Not on file  Tobacco Use   Smoking status: Never   Smokeless tobacco: Never  Vaping Use   Vaping Use: Never used  Substance and Sexual Activity   Alcohol use: Never    Alcohol/week: 0.0 standard drinks of alcohol   Drug use: No   Sexual activity: Not Currently    Comment: 1st intercourse 76 yo-Fewer than 5 partners  Other Topics Concern   Not on file  Social History Narrative   Not on file   Social Determinants of Health   Financial Resource Strain: Not on file  Food Insecurity: Not on file  Transportation Needs: Not on file  Physical Activity: Not on file  Stress: Not on file  Social Connections: Not on file  Intimate Partner Violence: Not on file   Family History  Problem Relation Age of Onset   Dementia Mother    Heart disease Mother 30   Heart disease Father    Cancer Maternal Grandmother        colon cancer    Review of Systems  All other systems reviewed and are negative.      Objective:   Physical Exam Vitals reviewed.  Constitutional:      General: She is not in acute distress.    Appearance: She is well-developed. She is not diaphoretic.  HENT:     Head: Normocephalic and atraumatic.     Right Ear: External ear normal.     Left Ear: External ear normal.     Nose: Nose normal.     Mouth/Throat:     Pharynx: No oropharyngeal exudate.  Eyes:     General: No scleral icterus.       Right eye: No discharge.        Left eye: No discharge.     Conjunctiva/sclera: Conjunctivae normal.     Pupils: Pupils are equal, round, and reactive to light.  Neck:     Thyroid: No thyromegaly.     Vascular: No JVD.     Trachea: No tracheal deviation.  Cardiovascular:     Rate and Rhythm: Normal rate and regular rhythm.     Heart sounds: Normal  heart sounds. No murmur heard.    No friction rub. No gallop.  Pulmonary:     Effort: Pulmonary effort is normal. No respiratory distress.     Breath sounds: Normal breath sounds. No stridor. No wheezing or rales.  Chest:     Chest wall: No tenderness.  Abdominal:     General: Bowel sounds are normal. There is no distension.     Palpations:  Abdomen is soft. There is no mass.     Tenderness: There is no abdominal tenderness. There is no guarding or rebound.  Musculoskeletal:        General: No tenderness. Normal range of motion.     Cervical back: Normal range of motion and neck supple.  Lymphadenopathy:     Cervical: No cervical adenopathy.  Skin:    General: Skin is warm.     Coloration: Skin is not pale.     Findings: No erythema or rash.  Neurological:     Mental Status: She is alert and oriented to person, place, and time.     Cranial Nerves: No cranial nerve deficit.     Motor: No abnormal muscle tone.     Coordination: Coordination normal.     Deep Tendon Reflexes: Reflexes are normal and symmetric.  Psychiatric:        Behavior: Behavior normal.        Thought Content: Thought content normal.        Judgment: Judgment normal.         Assessment & Plan:  Carotid stenosis, asymptomatic, right - Plan: CBC with Differential/Platelet, Lipid panel, COMPLETE METABOLIC PANEL WITH GFR  Routine general medical examination at a health care facility  Pure hypercholesterolemia  Age related osteoporosis, unspecified pathological fracture presence Patient was unable to tolerate Fosamax.  Therefore I will switch her to Prolia to try to prevent progression of her osteoporosis.  Recommend calcium 1200 mg a day and vitamin D 1000 units a day.  Mammogram is due in November.  Colonoscopy is up-to-date.  Due to age she does not require Pap smear.  Check CBC, CMP, and fasting lipid panel.  Goal LDL cholesterol is less than 70.  The remainder of her preventative care including her  immunizations are up-to-date.  She denies any falls or memory loss or depression.

## 2021-11-24 LAB — CBC WITH DIFFERENTIAL/PLATELET
Absolute Monocytes: 525 cells/uL (ref 200–950)
Basophils Absolute: 49 cells/uL (ref 0–200)
Basophils Relative: 0.8 %
Eosinophils Absolute: 110 cells/uL (ref 15–500)
Eosinophils Relative: 1.8 %
HCT: 40.7 % (ref 35.0–45.0)
Hemoglobin: 13.6 g/dL (ref 11.7–15.5)
Lymphs Abs: 2214 cells/uL (ref 850–3900)
MCH: 31.8 pg (ref 27.0–33.0)
MCHC: 33.4 g/dL (ref 32.0–36.0)
MCV: 95.1 fL (ref 80.0–100.0)
MPV: 11 fL (ref 7.5–12.5)
Monocytes Relative: 8.6 %
Neutro Abs: 3203 cells/uL (ref 1500–7800)
Neutrophils Relative %: 52.5 %
Platelets: 256 10*3/uL (ref 140–400)
RBC: 4.28 10*6/uL (ref 3.80–5.10)
RDW: 12.2 % (ref 11.0–15.0)
Total Lymphocyte: 36.3 %
WBC: 6.1 10*3/uL (ref 3.8–10.8)

## 2021-11-24 LAB — COMPLETE METABOLIC PANEL WITH GFR
AG Ratio: 1.8 (calc) (ref 1.0–2.5)
ALT: 11 U/L (ref 6–29)
AST: 12 U/L (ref 10–35)
Albumin: 4.5 g/dL (ref 3.6–5.1)
Alkaline phosphatase (APISO): 65 U/L (ref 37–153)
BUN: 9 mg/dL (ref 7–25)
CO2: 28 mmol/L (ref 20–32)
Calcium: 9.8 mg/dL (ref 8.6–10.4)
Chloride: 102 mmol/L (ref 98–110)
Creat: 0.88 mg/dL (ref 0.60–1.00)
Globulin: 2.5 g/dL (calc) (ref 1.9–3.7)
Glucose, Bld: 104 mg/dL — ABNORMAL HIGH (ref 65–99)
Potassium: 4.8 mmol/L (ref 3.5–5.3)
Sodium: 139 mmol/L (ref 135–146)
Total Bilirubin: 0.6 mg/dL (ref 0.2–1.2)
Total Protein: 7 g/dL (ref 6.1–8.1)
eGFR: 68 mL/min/{1.73_m2} (ref >=60)

## 2021-11-24 LAB — LIPID PANEL
Cholesterol: 177 mg/dL (ref <200)
HDL: 91 mg/dL (ref >=50)
LDL Cholesterol (Calc): 68 mg/dL (calc)
Non-HDL Cholesterol (Calc): 86 mg/dL (calc) (ref <130)
Total CHOL/HDL Ratio: 1.9 (calc) (ref <5.0)
Triglycerides: 94 mg/dL (ref <150)

## 2021-11-26 ENCOUNTER — Telehealth: Payer: Self-pay

## 2021-11-26 NOTE — Telephone Encounter (Signed)
Spoke with [employer] pharmacist to get PA-Prolia. [employer] will let us know via phone call or fax

## 2021-11-28 ENCOUNTER — Telehealth: Payer: Self-pay

## 2021-11-28 NOTE — Telephone Encounter (Signed)
Pt stated that she got a called [employer] saying that she doesn't meet their criteria for the Prolia injection since pt has never had any fractures/falls.  Pt would like to know how we can appeal it or is there something else ie meds she needs to do?

## 2021-11-30 NOTE — Telephone Encounter (Signed)
Call [employer] file an appeal today via phone w/Mai. It will take 7 days, will call pt back as soon as I hear something.

## 2021-12-07 ENCOUNTER — Other Ambulatory Visit: Payer: Self-pay | Admitting: Family Medicine

## 2021-12-07 NOTE — Telephone Encounter (Signed)
Requested medication (s) are due for refill today - yes  Requested medication (s) are on the active medication list -yes  Future visit scheduled -no  Last refill: 11/06/21 #90  Notes to clinic: non delegated Rx  Requested Prescriptions  Pending Prescriptions Disp Refills   ALPRAZolam (XANAX) 0.25 MG tablet [Pharmacy Med Name: ALPRAZOLAM 0.'25MG'$  TABLETS] 90 tablet     Sig: TAKE 1 TABLET(0.25 MG) BY MOUTH THREE TIMES DAILY AS NEEDED FOR ANXIETY     Not Delegated - Psychiatry: Anxiolytics/Hypnotics 2 Failed - 12/07/2021  5:50 AM      Failed - This refill cannot be delegated      Failed - Urine Drug Screen completed in last 360 days      Failed - Valid encounter within last 6 months    Recent Outpatient Visits           1 year ago Osteopenia of lower leg, unspecified laterality   [name], [name], MD   2 years ago Routine general medical examination at a health care facility   [place], [name], MD   3 years ago Routine general medical examination at a health care facility   [place], [name], MD   4 years ago Screening cholesterol level   [place], [name], MD   5 years ago Screening cholesterol level   [place], [name], MD              Passed - Patient is not pregnant         Requested Prescriptions  Pending Prescriptions Disp Refills   ALPRAZolam (XANAX) 0.25 MG tablet [Pharmacy Med Name: ALPRAZOLAM 0.'25MG'$  TABLETS] 90 tablet     Sig: TAKE 1 TABLET(0.25 MG) BY MOUTH THREE TIMES DAILY AS NEEDED FOR ANXIETY     Not Delegated - Psychiatry: Anxiolytics/Hypnotics 2 Failed - 12/07/2021  5:50 AM      Failed - This refill cannot be delegated      Failed - Urine Drug Screen completed in last 360 days      Failed - Valid encounter within last 6 months    Recent Outpatient Visits           1 year ago Osteopenia of lower leg,  unspecified laterality   [place] [name], MD   2 years ago Routine general medical examination at a health care facility   [place], [name], MD   3 years ago Routine general medical examination at a health care facility   [place], [name], MD   4 years ago Screening cholesterol level   [place], [name], MD   5 years ago Screening cholesterol level   [place], [name], MD              Passed - Patient is not pregnant

## 2021-12-10 NOTE — Telephone Encounter (Signed)
PA for Prolia as been Approved through 12/08/22  Pt aware

## 2021-12-11 ENCOUNTER — Other Ambulatory Visit: Payer: Self-pay | Admitting: Family Medicine

## 2021-12-13 ENCOUNTER — Other Ambulatory Visit: Payer: Self-pay

## 2021-12-13 MED ORDER — ALPRAZOLAM 0.25 MG PO TABS: ORAL_TABLET | ORAL | 0 refills | Status: DC

## 2021-12-13 NOTE — Telephone Encounter (Signed)
LOV 11/23/21 Last refill 11/06/21, #90, 0 refills  Pt stated that she is out only a few left.   Please review, thanks!

## 2021-12-13 NOTE — Progress Notes (Signed)
Duplicate

## 2021-12-19 ENCOUNTER — Other Ambulatory Visit: Payer: Self-pay | Admitting: *Deleted

## 2021-12-19 DIAGNOSIS — I6523 Occlusion and stenosis of bilateral carotid arteries: Secondary | ICD-10-CM

## 2021-12-20 ENCOUNTER — Ambulatory Visit: Payer: Medicare Other

## 2022-01-09 ENCOUNTER — Encounter (HOSPITAL_COMMUNITY): Payer: Medicare Other

## 2022-01-09 ENCOUNTER — Ambulatory Visit: Payer: Medicare Other

## 2022-01-14 ENCOUNTER — Other Ambulatory Visit: Payer: Self-pay | Admitting: Family Medicine

## 2022-02-11 NOTE — Progress Notes (Signed)
HISTORY AND PHYSICAL     CC:  follow up. Requesting Provider:  [name], MD  HPI: This is a 76 y.o. female here for follow up for carotid artery stenosis.  Pt was originally seen by Dr. Donnetta Hutching in 2021 after she was found to have asymptomatic carotid artery stenosis by duplex.  She had a right ICA stenosis of 40-59% on duplex.   She has hx of MV prolapse.  Pt was last seen 11/15/2020 and at that time she was doing well without neurological symptoms and her duplex was stable and she was scheduled to f/u in one year.  She was compliant with here asa/statin.  Her duplex revealed 1-39% bilateral ICA stenosis.  Pt returns today for follow up.    Pt denies any amaurosis fugax, speech difficulties, weakness, numbness, paralysis or clumsiness or facial droop.    She has hx of GI issues and is followed by GI and her PCP.   She states she stays on the move.  She works in [employer] and in the months of January to April, she works 80-90 hour weeks.   The pt is on a statin for cholesterol management.  The pt is on a daily aspirin.   Other AC:  none The pt is on ARB for hypertension.   The pt does not have diabetes Tobacco hx:  never  Pt does not have family hx of AAA.  Past Medical History:  Diagnosis Date   Anxiety    Carotid stenosis, asymptomatic, right    50-69% (2020)   Hyperlipidemia    Hypertension    IBS (irritable bowel syndrome)    Mitral valve prolapse    Osteopenia 06/2015   T score - 2.0   Urinary incontinence     Past Surgical History:  Procedure Laterality Date   ABDOMINAL HYSTERECTOMY  age 82   Leiomyomata   carpal tunnel repair     TUBAL LIGATION      Allergies  Allergen Reactions   Aspirin Nausea And Vomiting   Fosamax [Alendronate]     GI upset   Latex Itching    Current Outpatient Medications  Medication Sig Dispense Refill   ALPRAZolam (XANAX) 0.25 MG tablet TAKE 1 TABLET(0.25 MG) BY MOUTH THREE TIMES DAILY AS NEEDED FOR ANXIETY 90 tablet 2    aspirin EC 81 MG tablet Take 81 mg by mouth daily.     atorvastatin (LIPITOR) 20 MG tablet TAKE 1 TABLET(20 MG) BY MOUTH DAILY 90 tablet 0   Cholecalciferol (VITAMIN D PO) Take by mouth.     estradiol (ESTRACE) 0.5 MG tablet Take 1/2 a tablet every other day for the next month, then try to stop. 30 tablet 1   famotidine (PEPCID) 20 MG tablet Take 20 mg by mouth 2 (two) times daily.     famotidine (PEPCID) 40 MG tablet Take 40 mg by mouth 2 (two) times daily.     fluconazole (DIFLUCAN) 150 MG tablet Take 1 tablet (150 mg total) by mouth every 3 (three) days. (Patient not taking: Reported on 11/23/2021) 2 tablet 0   losartan (COZAAR) 50 MG tablet TAKE 1 TABLET(50 MG) BY MOUTH DAILY 90 tablet 0   meloxicam (MOBIC) 15 MG tablet TAKE 1 TABLET(15 MG) BY MOUTH DAILY 90 tablet 0   NONFORMULARY OR COMPOUNDED ITEM Estradiol vaginal cream 0.02% insert 1 gram vaginally twice weekly 90 each 3   Probiotic Product (PROBIOTIC PO) Take by mouth.     sulfamethoxazole-trimethoprim (BACTRIM DS) 800-160 MG  tablet Take 1 tablet by mouth 2 (two) times daily. (Patient not taking: Reported on 11/23/2021) 6 tablet 0   Wheat Dextrin (BENEFIBER PO) Take by mouth.     No current facility-administered medications for this visit.    Family History  Problem Relation Age of Onset   Dementia Mother    Heart disease Mother 44   Heart disease Father    Cancer Maternal Grandmother        colon cancer    Social History   Socioeconomic History   Marital status: Divorced    Spouse name: Not on file   Number of children: Not on file   Years of education: Not on file   Highest education level: Not on file  Occupational History   Not on file  Tobacco Use   Smoking status: Never   Smokeless tobacco: Never  Vaping Use   Vaping Use: Never used  Substance and Sexual Activity   Alcohol use: Never    Alcohol/week: 0.0 standard drinks of alcohol   Drug use: No   Sexual activity: Not Currently    Comment: 1st  intercourse 76 yo-Fewer than 5 partners  Other Topics Concern   Not on file  Social History Narrative   Not on file   Social Determinants of Health   Financial Resource Strain: Not on file  Food Insecurity: Not on file  Transportation Needs: Not on file  Physical Activity: Not on file  Stress: Not on file  Social Connections: Not on file  Intimate Partner Violence: Not on file     REVIEW OF SYSTEMS:   '[X]'$  denotes positive finding, '[ ]'$  denotes negative finding Cardiac  Comments:  Chest pain or chest pressure:    Shortness of breath upon exertion:    Short of breath when lying flat:    Irregular heart rhythm:        Vascular    Pain in calf, thigh, or hip brought on by ambulation:    Pain in feet at night that wakes you up from your sleep:     Blood clot in your veins:    Leg swelling:         Pulmonary    Oxygen at home:    Productive cough:     Wheezing:         Neurologic    Sudden weakness in arms or legs:     Sudden numbness in arms or legs:     Sudden onset of difficulty speaking or slurred speech:    Temporary loss of vision in one eye:     Problems with dizziness:         Gastrointestinal    Blood in stool:     Vomited blood:         Genitourinary    Burning when urinating:     Blood in urine:        Psychiatric    Major depression:         Hematologic    Bleeding problems:    Problems with blood clotting too easily:        Skin    Rashes or ulcers:        Constitutional    Fever or chills:      PHYSICAL EXAMINATION:  Today's Vitals   02/12/22 1439 02/12/22 1441  BP: 132/85 133/85  Pulse: 81   Resp: 20   Temp: 98 F (36.7 C)   TempSrc: Temporal   SpO2: 98%  Weight: 127 lb 14.4 oz (58 kg)   Height: 5' (1.524 m)    Body mass index is 24.98 kg/m.   General:  WDWN in NAD; vital signs documented above Gait: Not observed HENT: WNL, normocephalic Pulmonary: normal non-labored breathing Cardiac: regular HR, without carotid  bruits Abdomen: soft, NT; aortic pulse is not palpable Skin: without rashes Vascular Exam/Pulses:  Right Left  Radial 2+ (normal) 2+ (normal)  DP 2+ (normal) 2+ (normal)   Extremities: without open wounds Musculoskeletal: no muscle wasting or atrophy  Neurologic: A&O X 3; moving all extremities equally; speech is fluent/normal Psychiatric:  The pt has Normal affect.   Non-Invasive Vascular Imaging:   Carotid Duplex on 02/12/2022 Right:  40-59% ICA stenosis Left:  1-39% ICA stenosis Vertebrals:  Bilateral vertebral arteries demonstrate antegrade flow.  Subclavians: Normal flow hemodynamics were seen in bilateral subclavian arteries.   Previous Carotid duplex on 11/15/2020: Right: 1-39% ICA stenosis Left:   1-39% ICA stenosis    ASSESSMENT/PLAN:: 76 y.o. female here for follow up carotid artery stenosis  -duplex today reveals it is basically unchanged with 40-59% right ICA stenosis and left 1-39% ICA stenosis.  She remains asymptomatic.   -discussed s/s of stroke with pt and she understands should she develop any of these sx, she will go to the nearest ER or call 911. -pt will f/u in one year with carotid duplex -pt will call sooner should they have any issues. -continue statin/asa - she states that asa bothers her stomach.  She may be able to switch to plavix if her GI and PCP feel this would be beneficial for her.     Leontine Locket, [facility] [phone]  Clinic MD:  Carlis Abbott

## 2022-02-12 ENCOUNTER — Ambulatory Visit (HOSPITAL_COMMUNITY)
Admission: RE | Admit: 2022-02-12 | Discharge: 2022-02-12 | Disposition: A | Discharge status: Home / Self Care | Payer: Medicare Other | Source: Ambulatory Visit | Attending: Physician Assistant | Admitting: Physician Assistant

## 2022-02-12 ENCOUNTER — Ambulatory Visit (INDEPENDENT_AMBULATORY_CARE_PROVIDER_SITE_OTHER): Payer: Medicare Other | Admitting: Physician Assistant

## 2022-02-12 VITALS — BP 133/85 | HR 81 | Temp 98.0°F | Resp 20 | Ht 60.0 in | Wt 127.9 lb

## 2022-02-12 DIAGNOSIS — I6523 Occlusion and stenosis of bilateral carotid arteries: Secondary | ICD-10-CM

## 2022-02-12 DIAGNOSIS — I6521 Occlusion and stenosis of right carotid artery: Secondary | ICD-10-CM

## 2022-02-16 DIAGNOSIS — Z23 Encounter for immunization: Secondary | ICD-10-CM | POA: Diagnosis not present

## 2022-03-01 DIAGNOSIS — Z23 Encounter for immunization: Secondary | ICD-10-CM | POA: Diagnosis not present

## 2022-03-08 DIAGNOSIS — H04123 Dry eye syndrome of bilateral lacrimal glands: Secondary | ICD-10-CM | POA: Diagnosis not present

## 2022-03-08 DIAGNOSIS — H2513 Age-related nuclear cataract, bilateral: Secondary | ICD-10-CM | POA: Diagnosis not present

## 2022-03-08 DIAGNOSIS — H52203 Unspecified astigmatism, bilateral: Secondary | ICD-10-CM | POA: Diagnosis not present

## 2022-03-08 DIAGNOSIS — H25012 Cortical age-related cataract, left eye: Secondary | ICD-10-CM | POA: Diagnosis not present

## 2022-03-08 DIAGNOSIS — H5201 Hypermetropia, right eye: Secondary | ICD-10-CM | POA: Diagnosis not present

## 2022-03-10 ENCOUNTER — Other Ambulatory Visit: Payer: Self-pay | Admitting: Family Medicine

## 2022-03-11 ENCOUNTER — Other Ambulatory Visit: Payer: Self-pay | Admitting: Family Medicine

## 2022-03-11 NOTE — Telephone Encounter (Signed)
Pt. LOV with Dr. Dennard Schaumann 11/23/2021.   Due to glitch in system with [facility] the last visit is not picked up.   Refills given for 90 days since labs and last visit in date.  Requested Prescriptions  Pending Prescriptions Disp Refills  . atorvastatin (LIPITOR) 20 MG tablet [Pharmacy Med Name: ATORVASTATIN 20MG TABLETS] 90 tablet 0    Sig: TAKE 1 TABLET(20 MG) BY MOUTH DAILY     Cardiovascular:  Antilipid - Statins Failed - 03/11/2022  6:22 AM      Failed - Valid encounter within last 12 months    Recent Outpatient Visits          1 year ago Osteopenia of lower leg, unspecified laterality   [place] Dennard Schaumann, [name], MD   2 years ago Routine general medical examination at a health care facility   [place], [name], MD   3 years ago Routine general medical examination at a health care facility   [place], [name], MD   4 years ago Screening cholesterol level   [place] Dennard Schaumann, [name], MD   5 years ago Screening cholesterol level   [place] Dennard Schaumann, [name], MD             Failed - Lipid Panel in normal range within the last 12 months    Cholesterol  Date Value Ref Range Status  11/23/2021 177 <200 mg/dL Final   LDL Cholesterol (Calc)  Date Value Ref Range Status  11/23/2021 68 mg/dL (calc) Final    Comment:    Reference range: <100 . Desirable range <100 mg/dL for primary prevention;   <70 mg/dL for patients with CHD or diabetic patients  with > or = 2 CHD risk factors. Marland Kitchen LDL-C is now calculated using the Martin-Hopkins  calculation, which is a validated novel method providing  better accuracy than the Friedewald equation in the  estimation of LDL-C.  Cresenciano Genre et al. Annamaria Helling. 1007;121(97): 2061-2068  (http://education.[facility].com/faq/FAQ164)    HDL  Date Value Ref Range Status  11/23/2021 91 > OR = 50 mg/dL Final   Triglycerides  Date Value  Ref Range Status  11/23/2021 94 <150 mg/dL Final         Passed - Patient is not pregnant      . meloxicam (MOBIC) 15 MG tablet [Pharmacy Med Name: MELOXICAM 15MG TABLETS] 90 tablet 0    Sig: TAKE 1 TABLET(15 MG) BY MOUTH DAILY     Analgesics:  COX2 Inhibitors Failed - 03/11/2022  6:22 AM      Failed - Manual Review: Labs are only required if the patient has taken medication for more than 8 weeks.      Failed - Valid encounter within last 12 months    Recent Outpatient Visits          1 year ago Osteopenia of lower leg, unspecified laterality   [place], [name], MD   2 years ago Routine general medical examination at a health care facility   [place], [name], MD   3 years ago Routine general medical examination at a health care facility   [place], [name], MD   4 years ago Screening cholesterol level   [place], [name], MD   5 years ago Screening cholesterol level   [place] Dennard Schaumann, [name],  MD             Passed - HGB in normal range and within 360 days    Hemoglobin  Date Value Ref Range Status  11/23/2021 13.6 11.7 - 15.5 g/dL Final         Passed - Cr in normal range and within 360 days    Creat  Date Value Ref Range Status  11/23/2021 0.88 0.60 - 1.00 mg/dL Final         Passed - HCT in normal range and within 360 days    HCT  Date Value Ref Range Status  11/23/2021 40.7 35.0 - 45.0 % Final         Passed - AST in normal range and within 360 days    AST  Date Value Ref Range Status  11/23/2021 12 10 - 35 U/L Final         Passed - ALT in normal range and within 360 days    ALT  Date Value Ref Range Status  11/23/2021 11 6 - 29 U/L Final         Passed - eGFR is 30 or above and within 360 days    GFR, Est African American  Date Value Ref Range Status  11/10/2020 99 > OR = 60 mL/min/1.16m Final   GFR, Est  Non African American  Date Value Ref Range Status  11/10/2020 86 > OR = 60 mL/min/1.714mFinal   eGFR  Date Value Ref Range Status  11/23/2021 68 > OR = 60 mL/min/1.7313minal    Comment:    The eGFR is based on the CKD-EPI 2021 equation. To calculate  the new eGFR from a previous Creatinine or Cystatin C result, go to https://www.kidney.org/professionals/ kdoqi/gfr%5Fcalculator          Passed - Patient is not pregnant

## 2022-04-16 ENCOUNTER — Other Ambulatory Visit: Payer: Self-pay | Admitting: Family Medicine

## 2022-04-26 DIAGNOSIS — K219 Gastro-esophageal reflux disease without esophagitis: Secondary | ICD-10-CM | POA: Diagnosis not present

## 2022-04-26 DIAGNOSIS — K581 Irritable bowel syndrome with constipation: Secondary | ICD-10-CM | POA: Diagnosis not present

## 2022-04-26 DIAGNOSIS — R1084 Generalized abdominal pain: Secondary | ICD-10-CM | POA: Diagnosis not present

## 2022-05-11 DIAGNOSIS — Z1231 Encounter for screening mammogram for malignant neoplasm of breast: Secondary | ICD-10-CM | POA: Diagnosis not present

## 2022-05-11 LAB — HM MAMMOGRAPHY

## 2022-05-15 ENCOUNTER — Encounter: Payer: Self-pay | Admitting: Obstetrics and Gynecology

## 2022-05-16 ENCOUNTER — Other Ambulatory Visit: Payer: Self-pay | Admitting: Family Medicine

## 2022-05-16 NOTE — Telephone Encounter (Signed)
Requested medication (s) are due for refill today: Yes  Requested medication (s) are on the active medication list: Yes  Last refill:  04/16/22  Future visit scheduled: Yes  Notes to clinic:  Not delegated.    Requested Prescriptions  Pending Prescriptions Disp Refills   ALPRAZolam (XANAX) 0.25 MG tablet [Pharmacy Med Name: ALPRAZOLAM 0.'25MG'$  TABLETS] 90 tablet     Sig: TAKE 1 TABLET(0.25 MG) BY MOUTH THREE TIMES DAILY AS NEEDED FOR ANXIETY     Not Delegated - Psychiatry: Anxiolytics/Hypnotics 2 Failed - 05/16/2022  6:59 AM      Failed - This refill cannot be delegated      Failed - Urine Drug Screen completed in last 360 days      Failed - Valid encounter within last 6 months    Recent Outpatient Visits           1 year ago Osteopenia of lower leg, unspecified laterality   [place] [name], MD   2 years ago Routine general medical examination at a health care facility   [place], [name], MD   3 years ago Routine general medical examination at a health care facility   [place], [name], MD   4 years ago Screening cholesterol level   [place], [name], MD   6 years ago Screening cholesterol level   [place] Pickard, [name], MD              Passed - Patient is not pregnant

## 2022-06-09 ENCOUNTER — Other Ambulatory Visit: Payer: Self-pay | Admitting: Family Medicine

## 2022-06-11 ENCOUNTER — Other Ambulatory Visit: Payer: Self-pay | Admitting: Family Medicine

## 2022-06-17 ENCOUNTER — Other Ambulatory Visit: Payer: Self-pay | Admitting: Family Medicine

## 2022-06-24 ENCOUNTER — Ambulatory Visit: Payer: Medicare Other

## 2022-06-27 ENCOUNTER — Encounter: Payer: Self-pay | Admitting: Family Medicine

## 2022-07-20 ENCOUNTER — Other Ambulatory Visit: Payer: Self-pay | Admitting: Family Medicine

## 2022-07-22 NOTE — Telephone Encounter (Signed)
Requested medication (s) are due for refill today: yes  Requested medication (s) are on the active medication list: yes  Last refill:  06/17/22 #90  Future visit scheduled: no  Notes to clinic:  med not delegated to NT to RF; no UDS last 360 days   Requested Prescriptions  Pending Prescriptions Disp Refills   ALPRAZolam (XANAX) 0.25 MG tablet [Pharmacy Med Name: ALPRAZOLAM 0.25MG TABLETS] 90 tablet     Sig: TAKE 1 TABLET(0.25 MG) BY MOUTH THREE TIMES DAILY AS NEEDED FOR ANXIETY     Not Delegated - Psychiatry: Anxiolytics/Hypnotics 2 Failed - 07/20/2022  5:08 AM      Failed - This refill cannot be delegated      Failed - Urine Drug Screen completed in last 360 days      Failed - Valid encounter within last 6 months    Recent Outpatient Visits           1 year ago Osteopenia of lower leg, unspecified laterality   [place] [name], MD   2 years ago Routine general medical examination at a health care facility   [place], [name], MD   4 years ago Routine general medical examination at a health care facility   [place], [name], MD   5 years ago Screening cholesterol level   [place], [name], MD   6 years ago Screening cholesterol level   [place], [name], MD              Passed - Patient is not pregnant

## 2022-08-09 ENCOUNTER — Ambulatory Visit (INDEPENDENT_AMBULATORY_CARE_PROVIDER_SITE_OTHER): Payer: Medicare Other

## 2022-08-09 DIAGNOSIS — M8580 Other specified disorders of bone density and structure, unspecified site: Secondary | ICD-10-CM

## 2022-08-09 DIAGNOSIS — M8588 Other specified disorders of bone density and structure, other site: Secondary | ICD-10-CM | POA: Diagnosis not present

## 2022-08-09 MED ORDER — DENOSUMAB 60 MG/ML ~~LOC~~ SOSY
60.0000 mg | PREFILLED_SYRINGE | Freq: Once | SUBCUTANEOUS | Status: AC
  Administered 2022-08-09: 60 mg via SUBCUTANEOUS

## 2022-08-09 NOTE — Patient Instructions (Signed)
Pt was given her Prolia inj in the L-upper arm. No c/o per pt. Pt left ambulatory

## 2022-08-21 ENCOUNTER — Other Ambulatory Visit: Payer: Self-pay | Admitting: Family Medicine

## 2022-08-28 NOTE — Progress Notes (Deleted)
77 y.o. G2P0112 Divorced White or Caucasian Not Hispanic or Latino female here for annual exam.      No LMP recorded. Patient has had a hysterectomy.          Sexually active: {yes no:314532}  The current method of family planning is {contraception:315051}.    Exercising: {yes no:314532}  {types:19826} Smoker:  {YES P5382123  Health Maintenance: Pap:   07/24/10 WNL  History of abnormal Pap:  no MMG:  05/11/22 BMD:  05/16/21 osteoporotic  Colonoscopy: December 07, 2020 she had 2 polyps, f/u in 5 years.    TDaP:  12/19/08  is aware its out of date.  Gardasil: n/a   reports that she has never smoked. She has never been exposed to tobacco smoke. She has never used smokeless tobacco. She reports that she does not drink alcohol and does not use drugs.  Past Medical History:  Diagnosis Date   Anxiety    Carotid stenosis, asymptomatic, right    50-69% (2020)   Hyperlipidemia    Hypertension    IBS (irritable bowel syndrome)    Mitral valve prolapse    Osteopenia 06/2015   T score - 2.0   Urinary incontinence     Past Surgical History:  Procedure Laterality Date   ABDOMINAL HYSTERECTOMY  age 49   Leiomyomata   carpal tunnel repair     TUBAL LIGATION      Current Outpatient Medications  Medication Sig Dispense Refill   ALPRAZolam (XANAX) 0.25 MG tablet TAKE 1 TABLET(0.25 MG) BY MOUTH THREE TIMES DAILY AS NEEDED FOR ANXIETY 90 tablet 0   aspirin EC 81 MG tablet Take 81 mg by mouth daily.     atorvastatin (LIPITOR) 20 MG tablet TAKE 1 TABLET(20 MG) BY MOUTH DAILY 90 tablet 0   Cholecalciferol (VITAMIN D PO) Take by mouth.     estradiol (ESTRACE) 0.5 MG tablet Take 1/2 a tablet every other day for the next month, then try to stop. 30 tablet 1   famotidine (PEPCID) 20 MG tablet Take 20 mg by mouth 2 (two) times daily.     famotidine (PEPCID) 40 MG tablet Take 40 mg by mouth 2 (two) times daily.     fluconazole (DIFLUCAN) 150 MG tablet Take 1 tablet (150 mg total) by mouth every 3  (three) days. 2 tablet 0   losartan (COZAAR) 50 MG tablet TAKE 1 TABLET(50 MG) BY MOUTH DAILY 90 tablet 0   meloxicam (MOBIC) 15 MG tablet TAKE 1 TABLET(15 MG) BY MOUTH DAILY 90 tablet 0   NONFORMULARY OR COMPOUNDED ITEM Estradiol vaginal cream 0.02% insert 1 gram vaginally twice weekly 90 each 3   Probiotic Product (PROBIOTIC PO) Take by mouth.     sulfamethoxazole-trimethoprim (BACTRIM DS) 800-160 MG tablet Take 1 tablet by mouth 2 (two) times daily. 6 tablet 0   Wheat Dextrin (BENEFIBER PO) Take by mouth.     No current facility-administered medications for this visit.    Family History  Problem Relation Age of Onset   Dementia Mother    Heart disease Mother 71   Heart disease Father    Cancer Maternal Grandmother        colon cancer    Review of Systems  Exam:   There were no vitals taken for this visit.  Weight change: @WEIGHTCHANGE @ Height:      Ht Readings from Last 3 Encounters:  02/12/22 5' (1.524 m)  11/23/21 5' (1.524 m)  07/25/21 5' (1.524 m)    General  appearance: alert, cooperative and appears stated age Head: Normocephalic, without obvious abnormality, atraumatic Neck: no adenopathy, supple, symmetrical, trachea midline and thyroid {CHL AMB PHY EX THYROID NORM DEFAULT:[phone]::"normal to inspection and palpation"} Lungs: clear to auscultation bilaterally Cardiovascular: regular rate and rhythm Breasts: {Exam; breast:13139::"normal appearance, no masses or tenderness"} Abdomen: soft, non-tender; non distended,  no masses,  no organomegaly Extremities: extremities normal, atraumatic, no cyanosis or edema Skin: Skin color, texture, turgor normal. No rashes or lesions Lymph nodes: Cervical, supraclavicular, and axillary nodes normal. No abnormal inguinal nodes palpated Neurologic: Grossly normal   Pelvic: External genitalia:  no lesions              Urethra:  normal appearing urethra with no masses, tenderness or lesions              Bartholins and Skenes:  normal                 Vagina: normal appearing vagina with normal color and discharge, no lesions              Cervix: {CHL AMB PHY EX CERVIX NORM DEFAULT:[phone]::"no lesions"}               Bimanual Exam:  Uterus:  {CHL AMB PHY EX UTERUS NORM [phone]::"normal size, contour, position, consistency, mobility, non-tender"}              Adnexa: {CHL AMB PHY EX ADNEXA NO MASS DEFAULT:[phone]::"no mass, fullness, tenderness"}               Rectovaginal: Confirms               Anus:  normal sphincter tone, no lesions  *** chaperoned for the exam.  A:  Well Woman with normal exam  P:

## 2022-09-05 ENCOUNTER — Ambulatory Visit (INDEPENDENT_AMBULATORY_CARE_PROVIDER_SITE_OTHER): Payer: Medicare Other | Admitting: Obstetrics and Gynecology

## 2022-09-05 ENCOUNTER — Ambulatory Visit: Payer: Medicare Other | Admitting: Obstetrics and Gynecology

## 2022-09-05 ENCOUNTER — Encounter: Payer: Self-pay | Admitting: Obstetrics and Gynecology

## 2022-09-05 VITALS — BP 128/74 | HR 63 | Ht 60.0 in | Wt 122.2 lb

## 2022-09-05 DIAGNOSIS — Z01419 Encounter for gynecological examination (general) (routine) without abnormal findings: Secondary | ICD-10-CM | POA: Diagnosis not present

## 2022-09-05 DIAGNOSIS — N8111 Cystocele, midline: Secondary | ICD-10-CM | POA: Diagnosis not present

## 2022-09-05 NOTE — Progress Notes (Signed)
77 y.o. G2P0112 Divorced White or Caucasian Not Hispanic or Latino female here for breast and pelvic exam.  H/O hysterectomy. Went off of ERT last year, doing okay now. Still having some tolerable vasomotor symptoms.   She has a h/o carotid artery stenosis. She is on ASA and a statin.   Patient wants to discuss bladder prolapse. She notices a bulge every time she wipes and in the shower. It doesn't come out of her body. No discomfort with walking or sitting.  Occasionally has hesitancy to void. Normally feels she empties her bladder.  She has mild urge incontinence with a full bladder, small amounts. Will have mild leakage with valsalva when she has a full bladder.  She has IBS-C, she can go up to 2 weeks without a BM. Needs to strain with BM. She has seen GI. Has a f/u in 3 months.      No LMP recorded. Patient has had a hysterectomy.          Sexually active: No.  The current method of family planning is status post hysterectomy.    Exercising: Yes.     Walking  Smoker:  no  Health Maintenance: Pap:07/24/10 WNL  History of abnormal Pap:  no MMG:  05/11/22 Bi-Rads cat 1: negative BMD:  05/16/21 osteoporotic, she is on Prolia with her primary. Colonoscopy: December 07, 2020 she had 2 polyps, f/u in 5 years.    TDaP:  12/19/08  is aware its out of date.  Gardasil: n/a   reports that she has never smoked. She has never been exposed to tobacco smoke. She has never used smokeless tobacco. She reports that she does not drink alcohol and does not use drugs. 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 She has 78 year old identical twin sons. She has an 39 year old grandson.   Past Medical History:  Diagnosis Date   Anxiety    Carotid stenosis, asymptomatic, right    50-69% (2020)   Hyperlipidemia    Hypertension    IBS (irritable bowel syndrome)    Mitral valve prolapse    Osteopenia 06/2015   T score - 2.0   Urinary incontinence     Past Surgical History:  Procedure  Laterality Date   ABDOMINAL HYSTERECTOMY  age 78   Leiomyomata   carpal tunnel repair     TUBAL LIGATION      Current Outpatient Medications  Medication Sig Dispense Refill   ALPRAZolam (XANAX) 0.25 MG tablet TAKE 1 TABLET(0.25 MG) BY MOUTH THREE TIMES DAILY AS NEEDED FOR ANXIETY 90 tablet 0   aspirin EC 81 MG tablet Take 81 mg by mouth daily.     atorvastatin (LIPITOR) 20 MG tablet TAKE 1 TABLET(20 MG) BY MOUTH DAILY 90 tablet 0   Cholecalciferol (VITAMIN D PO) Take by mouth.     denosumab (PROLIA) 60 MG/ML SOSY injection Inject 60 mg into the skin every 6 (six) months.     famotidine (PEPCID) 20 MG tablet Take 20 mg by mouth 2 (two) times daily.     famotidine (PEPCID) 40 MG tablet Take 40 mg by mouth 2 (two) times daily.     losartan (COZAAR) 50 MG tablet TAKE 1 TABLET(50 MG) BY MOUTH DAILY 90 tablet 0   meloxicam (MOBIC) 15 MG tablet TAKE 1 TABLET(15 MG) BY MOUTH DAILY 90 tablet 0   pantoprazole (PROTONIX) 40 MG tablet Take 40 mg by mouth daily.     Probiotic Product (PROBIOTIC  PO) Take by mouth.     Wheat Dextrin (BENEFIBER PO) Take by mouth.     No current facility-administered medications for this visit.    Family History  Problem Relation Age of Onset   Dementia Mother    Heart disease Mother 25   Heart disease Father    Cancer Maternal Grandmother        colon cancer    Review of Systems  All other systems reviewed and are negative.   Exam:   BP 128/74   Pulse 63   Ht 5' (1.524 m)   Wt 122 lb 3.2 oz (55.4 kg)   SpO2 100%   BMI 23.87 kg/m   Weight change: @WEIGHTCHANGE @ Height:   Height: 5' (152.4 cm)  Ht Readings from Last 3 Encounters:  09/05/22 5' (1.524 m)  02/12/22 5' (1.524 m)  11/23/21 5' (1.524 m)    General appearance: alert, cooperative and appears stated age Head: Normocephalic, without obvious abnormality, atraumatic Neck: no adenopathy, supple, symmetrical, trachea midline and thyroid normal to inspection and palpation Breasts: normal  appearance, no masses or tenderness Abdomen: soft, non-tender; non distended,  no masses,  no organomegaly Extremities: extremities normal, atraumatic, no cyanosis or edema Skin: Skin color, texture, turgor normal. No rashes or lesions Lymph nodes: Cervical, supraclavicular, and axillary nodes normal. No abnormal inguinal nodes palpated Neurologic: Grossly normal   Pelvic: External genitalia:  no lesions              Urethra:  normal appearing urethra with no masses, tenderness or lesions              Bartholins and Skenes: normal                 Vagina:atrophic appearing vagina with a small grade 1-2 cystocele (same as described in 2/22). No vault prolapse or rectocele. Patient examined supine and standing with and without valsalva.               Cervix: absent               Bimanual Exam:  Uterus:  uterus absent              Adnexa: no mass, fullness, tenderness               Rectovaginal: Confirms               Anus:  normal sphincter tone, no lesions  Gae Dry, CMA chaperoned for the exam.  1. Gynecologic exam normal No pap needed Mammogram and colonoscopy utd DEXA with primary Labs with primary  2. Cystocele, midline Mild and not symptomatic, patient reassured. Patient does have issues with constipation, we discussed that straining can worsen prolapse. Recommended that she f/u with her GI MD about management.

## 2022-09-23 ENCOUNTER — Other Ambulatory Visit: Payer: Self-pay | Admitting: Family Medicine

## 2022-09-24 NOTE — Telephone Encounter (Signed)
Requested medication (s) are due for refill today: Yes  Requested medication (s) are on the active medication list:Yes  Last refill:  08/22/22  Future visit scheduled: Yes  Notes to clinic:  Unable to refill per protocol, cannot delegate.      Requested Prescriptions  Pending Prescriptions Disp Refills   ALPRAZolam (XANAX) 0.25 MG tablet [Pharmacy Med Name: ALPRAZOLAM 0.25MG  TABLETS] 90 tablet     Sig: TAKE 1 TABLET(0.25 MG) BY MOUTH THREE TIMES DAILY AS NEEDED FOR ANXIETY     Not Delegated - Psychiatry: Anxiolytics/Hypnotics 2 Failed - 09/23/2022  6:59 AM      Failed - This refill cannot be delegated      Failed - Urine Drug Screen completed in last 360 days      Failed - Valid encounter within last 6 months    Recent Outpatient Visits           1 year ago Osteopenia of lower leg, unspecified laterality   [facility] Donita Brooks, MD   2 years ago Routine general medical examination at a health care facility   [facility] Pickard, Priscille Heidelberg, MD   4 years ago Routine general medical examination at a health care facility   [facility] Pickard, Priscille Heidelberg, MD   5 years ago Screening cholesterol level   [facility] Donita Brooks, MD   6 years ago Screening cholesterol level   [facility] Pickard, Priscille Heidelberg, MD       Future Appointments             In 2 months Pickard, Priscille Heidelberg, MD [facility], [facility]            Passed - Patient is not pregnant

## 2022-10-24 ENCOUNTER — Other Ambulatory Visit: Payer: Self-pay | Admitting: Family Medicine

## 2022-10-24 NOTE — Telephone Encounter (Signed)
Requested medication (s) are due for refill today: yes  Requested medication (s) are on the active medication list: yes  Last refill:  09/24/22  Future visit scheduled: yes  Notes to clinic:  Unable to refill per protocol, cannot delegate.      Requested Prescriptions  Pending Prescriptions Disp Refills   ALPRAZolam (XANAX) 0.25 MG tablet [Pharmacy Med Name: ALPRAZOLAM 0.25MG  TABLETS] 90 tablet     Sig: TAKE 1 TABLET(0.25 MG) BY MOUTH THREE TIMES DAILY AS NEEDED FOR ANXIETY     Not Delegated - Psychiatry: Anxiolytics/Hypnotics 2 Failed - 10/24/2022  5:47 AM      Failed - This refill cannot be delegated      Failed - Urine Drug Screen completed in last 360 days      Failed - Valid encounter within last 6 months    Recent Outpatient Visits           1 year ago Osteopenia of lower leg, unspecified laterality   [facility] Donita Brooks, MD   3 years ago Routine general medical examination at a health care facility   [facility] Pickard, Priscille Heidelberg, MD   4 years ago Routine general medical examination at a health care facility   [facility] Pickard, Priscille Heidelberg, MD   5 years ago Screening cholesterol level   [facility] Donita Brooks, MD   6 years ago Screening cholesterol level   [facility] Pickard, Priscille Heidelberg, MD       Future Appointments             In 1 month Pickard, Priscille Heidelberg, MD [facility], [facility]            Passed - Patient is not pregnant

## 2022-11-25 ENCOUNTER — Other Ambulatory Visit: Payer: Self-pay | Admitting: Family Medicine

## 2022-11-26 ENCOUNTER — Ambulatory Visit (INDEPENDENT_AMBULATORY_CARE_PROVIDER_SITE_OTHER): Payer: Medicare Other | Admitting: Family Medicine

## 2022-11-26 ENCOUNTER — Encounter: Payer: Self-pay | Admitting: Family Medicine

## 2022-11-26 VITALS — BP 136/84 | HR 98 | Temp 97.7°F | Ht 60.0 in | Wt 122.2 lb

## 2022-11-26 DIAGNOSIS — M810 Age-related osteoporosis without current pathological fracture: Secondary | ICD-10-CM | POA: Diagnosis not present

## 2022-11-26 DIAGNOSIS — I6521 Occlusion and stenosis of right carotid artery: Secondary | ICD-10-CM

## 2022-11-26 DIAGNOSIS — Z0001 Encounter for general adult medical examination with abnormal findings: Secondary | ICD-10-CM

## 2022-11-26 DIAGNOSIS — Z0000 Encounter for general adult medical examination without abnormal findings: Secondary | ICD-10-CM

## 2022-11-26 DIAGNOSIS — E7800 Pure hypercholesterolemia, unspecified: Secondary | ICD-10-CM

## 2022-11-26 DIAGNOSIS — M5432 Sciatica, left side: Secondary | ICD-10-CM

## 2022-11-26 LAB — COMPLETE METABOLIC PANEL WITH GFR
AG Ratio: 1.8 (calc) (ref 1.0–2.5)
ALT: 12 U/L (ref 6–29)
AST: 13 U/L (ref 10–35)
Albumin: 4.6 g/dL (ref 3.6–5.1)
Alkaline phosphatase (APISO): 45 U/L (ref 37–153)
BUN: 11 mg/dL (ref 7–25)
CO2: 31 mmol/L (ref 20–32)
Calcium: 9.8 mg/dL (ref 8.6–10.4)
Chloride: 100 mmol/L (ref 98–110)
Creat: 0.7 mg/dL (ref 0.60–1.00)
Globulin: 2.6 g/dL (calc) (ref 1.9–3.7)
Glucose, Bld: 94 mg/dL (ref 65–99)
Potassium: 4.3 mmol/L (ref 3.5–5.3)
Sodium: 139 mmol/L (ref 135–146)
Total Bilirubin: 0.9 mg/dL (ref 0.2–1.2)
Total Protein: 7.2 g/dL (ref 6.1–8.1)
eGFR: 89 mL/min/{1.73_m2} (ref >=60)

## 2022-11-26 LAB — CBC WITH DIFFERENTIAL/PLATELET
Absolute Monocytes: 393 cells/uL (ref 200–950)
Basophils Absolute: 41 cells/uL (ref 0–200)
Basophils Relative: 0.6 %
Eosinophils Absolute: 90 cells/uL (ref 15–500)
Eosinophils Relative: 1.3 %
HCT: 40.4 % (ref 35.0–45.0)
Hemoglobin: 13.3 g/dL (ref 11.7–15.5)
Lymphs Abs: 2187 cells/uL (ref 850–3900)
MCH: 30.9 pg (ref 27.0–33.0)
MCHC: 32.9 g/dL (ref 32.0–36.0)
MCV: 94 fL (ref 80.0–100.0)
MPV: 11.4 fL (ref 7.5–12.5)
Monocytes Relative: 5.7 %
Neutro Abs: 4188 cells/uL (ref 1500–7800)
Neutrophils Relative %: 60.7 %
Platelets: 232 10*3/uL (ref 140–400)
RBC: 4.3 10*6/uL (ref 3.80–5.10)
RDW: 12.1 % (ref 11.0–15.0)
Total Lymphocyte: 31.7 %
WBC: 6.9 10*3/uL (ref 3.8–10.8)

## 2022-11-26 LAB — LIPID PANEL
Cholesterol: 193 mg/dL (ref <200)
HDL: 91 mg/dL (ref >=50)
LDL Cholesterol (Calc): 81 mg/dL (calc)
Non-HDL Cholesterol (Calc): 102 mg/dL (calc) (ref <130)
Total CHOL/HDL Ratio: 2.1 (calc) (ref <5.0)
Triglycerides: 115 mg/dL (ref <150)

## 2022-11-26 NOTE — Progress Notes (Signed)
Subjective:    Patient ID: Melissa Owen, female    DOB: Mar 04, 1946, 77 y.o.   MRN: [id number]  HPI Subjective:   Patient is a very pleasant 77 year old Caucasian female here today for complete physical exam.  Past medical history significant for osteoporosis.  She is currently on Prolia.  Her last bone density test was less than 2 years ago.  She is due at the beginning of 2025 for repeat bone density test.  Her last milligram December 2023 and is up-to-date.  Last colonoscopy was in 2022.  They recommended a repeat colonoscopy after 5 years but that would make her 77 years old so therefore I do not feel that she needs a repeat colonoscopy if she is asymptomatic.  Due to her age she does not require Pap smear.  We reviewed her immunizations.  All of her immunizations are up-to-date except for COVID booster in the fall as well as a flu shot in the fall.  She denies any falls or depression or memory loss.  She sees a gynecologist for pelvic exam Immunization History  Administered Date(s) Administered   Influenza Whole 03/10/2009   Influenza, High Dose Seasonal PF 02/25/2016, 03/12/2018, 01/30/2019   Influenza,inj,Quad PF,6+ Mos 02/16/2013, 03/14/2014, 05/02/2015   Influenza-Unspecified 03/10/2017   PFIZER(Purple Top)SARS-COV-2 Vaccination 06/30/2019, 07/21/2019, 03/03/2020, 09/19/2020   Pneumococcal Conjugate-13 03/14/2014   Pneumococcal Polysaccharide-23 03/30/2009, 10/06/2019   Tdap 12/19/2008   Zoster Recombinat (Shingrix) 10/08/2016, 12/08/2016   Zoster, Live 12/19/2008    Review Past Medical/Family/Social: Past Medical History:  Diagnosis Date   Anxiety    Carotid stenosis, asymptomatic, right    50-69% (2020)   Hyperlipidemia    Hypertension    IBS (irritable bowel syndrome)    Mitral valve prolapse    Osteopenia 06/2015   T score - 2.0   Urinary incontinence    Past Surgical History:  Procedure Laterality Date   ABDOMINAL HYSTERECTOMY  age 89   Leiomyomata   carpal  tunnel repair     TUBAL LIGATION     Current Outpatient Medications on File Prior to Visit  Medication Sig Dispense Refill   ALPRAZolam (XANAX) 0.25 MG tablet TAKE 1 TABLET(0.25 MG) BY MOUTH THREE TIMES DAILY AS NEEDED FOR ANXIETY 90 tablet 0   aspirin EC 81 MG tablet Take 81 mg by mouth daily.     atorvastatin (LIPITOR) 20 MG tablet TAKE 1 TABLET(20 MG) BY MOUTH DAILY 90 tablet 0   Cholecalciferol (VITAMIN D PO) Take by mouth.     denosumab (PROLIA) 60 MG/ML SOSY injection Inject 60 mg into the skin every 6 (six) months.     famotidine (PEPCID) 40 MG tablet Take 40 mg by mouth 2 (two) times daily.     LINZESS 145 MCG CAPS capsule Take 145 mcg by mouth every morning.     losartan (COZAAR) 50 MG tablet TAKE 1 TABLET(50 MG) BY MOUTH DAILY 90 tablet 0   meloxicam (MOBIC) 15 MG tablet TAKE 1 TABLET(15 MG) BY MOUTH DAILY 90 tablet 0   pantoprazole (PROTONIX) 40 MG tablet Take 40 mg by mouth daily.     Probiotic Product (PROBIOTIC PO) Take by mouth.     Wheat Dextrin (BENEFIBER PO) Take by mouth.     No current facility-administered medications on file prior to visit.   Allergies  Allergen Reactions   Aspirin Nausea And Vomiting   Fosamax [Alendronate]     GI upset   Latex Itching   Social History   Socioeconomic  History   Marital status: Divorced    Spouse name: Not on file   Number of children: Not on file   Years of education: Not on file   Highest education level: Not on file  Occupational History   Not on file  Tobacco Use   Smoking status: Never    Passive exposure: Never   Smokeless tobacco: Never  Vaping Use   Vaping Use: Never used  Substance and Sexual Activity   Alcohol use: Never    Alcohol/week: 0.0 standard drinks of alcohol   Drug use: No   Sexual activity: Not Currently    Comment: 1st intercourse 77 yo-Fewer than 5 partners  Other Topics Concern   Not on file  Social History Narrative   Not on file   Social Determinants of Health   Financial Resource  Strain: Not on file  Food Insecurity: Not on file  Transportation Needs: Not on file  Physical Activity: Not on file  Stress: Not on file  Social Connections: Not on file  Intimate Partner Violence: Not on file   Family History  Problem Relation Age of Onset   Dementia Mother    Heart disease Mother 77   Heart disease Father    Cancer Maternal Grandmother        colon cancer    Review of Systems  All other systems reviewed and are negative.      Objective:   Physical Exam Vitals reviewed.  Constitutional:      General: She is not in acute distress.    Appearance: She is well-developed. She is not diaphoretic.  HENT:     Head: Normocephalic and atraumatic.     Right Ear: External ear normal.     Left Ear: External ear normal.     Nose: Nose normal.     Mouth/Throat:     Pharynx: No oropharyngeal exudate.  Eyes:     General: No scleral icterus.       Right eye: No discharge.        Left eye: No discharge.     Conjunctiva/sclera: Conjunctivae normal.     Pupils: Pupils are equal, round, and reactive to light.  Neck:     Thyroid: No thyromegaly.     Vascular: No JVD.     Trachea: No tracheal deviation.  Cardiovascular:     Rate and Rhythm: Normal rate and regular rhythm.     Heart sounds: Normal heart sounds. No murmur heard.    No friction rub. No gallop.  Pulmonary:     Effort: Pulmonary effort is normal. No respiratory distress.     Breath sounds: Normal breath sounds. No stridor. No wheezing or rales.  Chest:     Chest wall: No tenderness.  Abdominal:     General: Bowel sounds are normal. There is no distension.     Palpations: Abdomen is soft. There is no mass.     Tenderness: There is no abdominal tenderness. There is no guarding or rebound.  Musculoskeletal:        General: No tenderness. Normal range of motion.     Cervical back: Normal range of motion and neck supple.  Lymphadenopathy:     Cervical: No cervical adenopathy.  Skin:    General: Skin  is warm.     Coloration: Skin is not pale.     Findings: No erythema or rash.  Neurological:     Mental Status: She is alert and oriented to person, place, and time.  Cranial Nerves: No cranial nerve deficit.     Motor: No abnormal muscle tone.     Coordination: Coordination normal.     Deep Tendon Reflexes: Reflexes are normal and symmetric.  Psychiatric:        Behavior: Behavior normal.        Thought Content: Thought content normal.        Judgment: Judgment normal.         Assessment & Plan:  Left sided sciatica - Plan: DG Lumbar Spine Complete  Carotid stenosis, asymptomatic, right - Plan: CBC with Differential/Platelet, Lipid panel, COMPLETE METABOLIC PANEL WITH GFR, US Carotid Duplex Bilateral  Age related osteoporosis, unspecified pathological fracture presence  Pure hypercholesterolemia  Routine general medical examination at a health care facility Patient's preventative care is up-to-date.  Her shots are up-to-date she is already had RSV shot.  I would recommend a flu shot in the fall.  Her mammogram is due in December but she usually schedules this.  Her bone density test will be due at the first part in 25.  She does not require another colonoscopy or Pap smear.  She does have a history of carotid artery stenosis so we will schedule her for an ultrasound to follow-up on that and I will also check a CBC a CMP and a lipid panel.  I like to keep her LDL cholesterol less than 70 if possible.  Patient also reports in her review of systems that she is having left-sided sciatica as well as some low back pain.  Therefore we will obtain an x-ray of the lumbar spine to evaluate further.  Currently she is managing the pain with Mobic.  She denies any falls, depression, or memory loss

## 2022-11-28 ENCOUNTER — Ambulatory Visit
Admission: RE | Admit: 2022-11-28 | Discharge: 2022-11-28 | Disposition: A | Discharge status: Home / Self Care | Payer: Medicare Other | Source: Ambulatory Visit | Attending: Family Medicine | Admitting: Family Medicine

## 2022-11-28 DIAGNOSIS — M5442 Lumbago with sciatica, left side: Secondary | ICD-10-CM | POA: Diagnosis not present

## 2022-11-28 DIAGNOSIS — M5432 Sciatica, left side: Secondary | ICD-10-CM

## 2022-11-29 ENCOUNTER — Telehealth: Payer: Self-pay

## 2022-11-29 NOTE — Telephone Encounter (Signed)
Pt called re: Order for US Carotid Bilateral:  Spoke w/pt, per pt she gets this particular procedure done every year w/[facility], are you ok with pt to just have them continue doing this? Or is there a reason you need for pt to have done else where?  Pls advice?

## 2022-11-29 NOTE — Telephone Encounter (Signed)
Pt called in wanting to speak with nurse about her upcoming appt for her ultrasound of her arteries. Pt just wants to know if she should keep the 2 appts, she has scheduled? Please advise.  Cb#: [phone]

## 2022-11-29 NOTE — Telephone Encounter (Signed)
Called pt back and advise pt per pcp that was ok. Pt voiced understanding.

## 2022-12-08 ENCOUNTER — Other Ambulatory Visit: Payer: Self-pay | Admitting: Family Medicine

## 2022-12-09 NOTE — Telephone Encounter (Signed)
Requested Prescriptions  Pending Prescriptions Disp Refills   atorvastatin (LIPITOR) 20 MG tablet [Pharmacy Med Name: ATORVASTATIN 20MG  TABLETS] 90 tablet 0    Sig: TAKE 1 TABLET(20 MG) BY MOUTH DAILY     Cardiovascular:  Antilipid - Statins Failed - 12/08/2022  4:28 AM      Failed - Valid encounter within last 12 months    Recent Outpatient Visits           2 years ago Osteopenia of lower leg, unspecified laterality   [facility] Pickard, Priscille Heidelberg, MD   3 years ago Routine general medical examination at a health care facility   [facility] Pickard, Priscille Heidelberg, MD   4 years ago Routine general medical examination at a health care facility   [facility] Pickard, Priscille Heidelberg, MD   5 years ago Screening cholesterol level   [facility] Tanya Nones, Priscille Heidelberg, MD   6 years ago Screening cholesterol level   [facility] Pickard, Priscille Heidelberg, MD       Future Appointments             In 11 months Pickard, Priscille Heidelberg, MD [facility], [facility]            Failed - Lipid Panel in normal range within the last 12 months    Cholesterol  Date Value Ref Range Status  11/26/2022 193 <200 mg/dL Final   LDL Cholesterol (Calc)  Date Value Ref Range Status  11/26/2022 81 mg/dL (calc) Final    Comment:    Reference range: <100 . Desirable range <100 mg/dL for primary prevention;   <70 mg/dL for patients with CHD or diabetic patients  with > or = 2 CHD risk factors. Marland Kitchen LDL-C is now calculated using the Martin-Hopkins  calculation, which is a validated novel method providing  better accuracy than the Friedewald equation in the  estimation of LDL-C.  Horald Pollen et al. Lenox Ahr. 1610;960(45): 2061-2068  (http://education.[facility].com/faq/FAQ164)    HDL  Date Value Ref Range Status  11/26/2022 91 > OR = 50 mg/dL Final   Triglycerides  Date Value Ref Range Status  11/26/2022 115 <150 mg/dL  Final         Passed - Patient is not pregnant       losartan (COZAAR) 50 MG tablet [Pharmacy Med Name: LOSARTAN 50MG  TABLETS] 90 tablet 0    Sig: TAKE 1 TABLET(50 MG) BY MOUTH DAILY     Cardiovascular:  Angiotensin Receptor Blockers Failed - 12/08/2022  4:28 AM      Failed - Valid encounter within last 6 months    Recent Outpatient Visits           2 years ago Osteopenia of lower leg, unspecified laterality   [facility] Donita Brooks, MD   3 years ago Routine general medical examination at a health care facility   [facility] Pickard, Priscille Heidelberg, MD   4 years ago Routine general medical examination at a health care facility   [facility] Pickard, Priscille Heidelberg, MD   5 years ago Screening cholesterol level   [facility] Tanya Nones, Priscille Heidelberg, MD   6 years ago Screening cholesterol level   [facility] Pickard, Priscille Heidelberg, MD       Future Appointments             In 11 months Pickard,  Priscille Heidelberg, MD [facility], [facility]            Passed - Cr in normal range and within 180 days    Creat  Date Value Ref Range Status  11/26/2022 0.70 0.60 - 1.00 mg/dL Final         Passed - K in normal range and within 180 days    Potassium  Date Value Ref Range Status  11/26/2022 4.3 3.5 - 5.3 mmol/L Final         Passed - Patient is not pregnant      Passed - Last BP in normal range    BP Readings from Last 1 Encounters:  11/26/22 136/84

## 2022-12-25 ENCOUNTER — Other Ambulatory Visit: Payer: Self-pay | Admitting: Family Medicine

## 2023-02-04 ENCOUNTER — Other Ambulatory Visit: Payer: Self-pay | Admitting: *Deleted

## 2023-02-04 DIAGNOSIS — I6523 Occlusion and stenosis of bilateral carotid arteries: Secondary | ICD-10-CM

## 2023-02-11 ENCOUNTER — Ambulatory Visit: Payer: Medicare Other

## 2023-02-17 ENCOUNTER — Ambulatory Visit (INDEPENDENT_AMBULATORY_CARE_PROVIDER_SITE_OTHER): Payer: Medicare Other | Admitting: Physician Assistant

## 2023-02-17 ENCOUNTER — Ambulatory Visit (HOSPITAL_COMMUNITY)
Admission: RE | Admit: 2023-02-17 | Discharge: 2023-02-17 | Disposition: A | Discharge status: Home / Self Care | Payer: Medicare Other | Source: Ambulatory Visit | Attending: Surgery | Admitting: Surgery

## 2023-02-17 VITALS — BP 132/83 | HR 84 | Temp 98.4°F | Resp 16 | Ht 60.0 in | Wt 124.0 lb

## 2023-02-17 DIAGNOSIS — I6523 Occlusion and stenosis of bilateral carotid arteries: Secondary | ICD-10-CM

## 2023-02-17 NOTE — Progress Notes (Signed)
History of Present Illness:  Patient is a 77 y.o. year old female who presents for evaluation of carotid stenosis.  She was originally followed by Dr. Arbie Cookey when her carotid stenosis was found she was asymptomatic.  The right ICA was found to have 40-59% stenosis.    The patient denies symptoms of TIA, amaurosis, or stroke.  She denies amaurosis fugax, speech difficulties, weakness, numbness, paralysis or clumsiness or facial droop.  She is medically managed on ASA/Statin.      Past Medical History:  Diagnosis Date   Anxiety    Carotid stenosis, asymptomatic, right    50-69% (2020)   Hyperlipidemia    Hypertension    IBS (irritable bowel syndrome)    Mitral valve prolapse    Osteopenia 06/2015   T score - 2.0   Urinary incontinence     Past Surgical History:  Procedure Laterality Date   ABDOMINAL HYSTERECTOMY  age 79   Leiomyomata   carpal tunnel repair     TUBAL LIGATION       Social History Social History   Tobacco Use   Smoking status: Never    Passive exposure: Never   Smokeless tobacco: Never  Vaping Use   Vaping status: Never Used  Substance Use Topics   Alcohol use: Never    Alcohol/week: 0.0 standard drinks of alcohol   Drug use: No    Family History Family History  Problem Relation Age of Onset   Dementia Mother    Heart disease Mother 80   Heart disease Father    Cancer Maternal Grandmother        colon cancer    Allergies  Allergies  Allergen Reactions   Aspirin Nausea And Vomiting   Fosamax [Alendronate]     GI upset   Latex Itching     Current Outpatient Medications  Medication Sig Dispense Refill   ALPRAZolam (XANAX) 0.25 MG tablet TAKE 1 TABLET(0.25 MG) BY MOUTH THREE TIMES DAILY AS NEEDED FOR ANXIETY 90 tablet 1   aspirin EC 81 MG tablet Take 81 mg by mouth daily.     atorvastatin (LIPITOR) 20 MG tablet TAKE 1 TABLET(20 MG) BY MOUTH DAILY 90 tablet 0   Cholecalciferol (VITAMIN D PO) Take by mouth.     denosumab (PROLIA)  60 MG/ML SOSY injection Inject 60 mg into the skin every 6 (six) months.     famotidine (PEPCID) 40 MG tablet Take 40 mg by mouth 2 (two) times daily.     losartan (COZAAR) 50 MG tablet TAKE 1 TABLET(50 MG) BY MOUTH DAILY 90 tablet 0   meloxicam (MOBIC) 15 MG tablet TAKE 1 TABLET(15 MG) BY MOUTH DAILY 90 tablet 0   pantoprazole (PROTONIX) 40 MG tablet Take 40 mg by mouth daily.     Probiotic Product (PROBIOTIC PO) Take by mouth.     Wheat Dextrin (BENEFIBER PO) Take by mouth.     LINZESS 145 MCG CAPS capsule Take 145 mcg by mouth every morning. (Patient not taking: Reported on 02/17/2023)     No current facility-administered medications for this visit.    ROS:   General:  No weight loss, Fever, chills  HEENT: No recent headaches, no nasal bleeding, no visual changes, no sore throat  Neurologic: No dizziness, blackouts, seizures. No recent symptoms of stroke or mini- stroke. No recent episodes of slurred speech, or temporary blindness.  Cardiac: No recent episodes of chest pain/pressure, no shortness of breath at rest.  No shortness of  breath with exertion.  Denies history of atrial fibrillation or irregular heartbeat  Vascular: No history of rest pain in feet.  No history of claudication.  No history of non-healing ulcer, No history of DVT   Pulmonary: No home oxygen, no productive cough, no hemoptysis,  No asthma or wheezing  Musculoskeletal:  [ ]  Arthritis, [ ]  Low back pain,  [ ]  Joint pain  Hematologic:No history of hypercoagulable state.  No history of easy bleeding.  No history of anemia  Gastrointestinal: No hematochezia or melena,  No gastroesophageal reflux, no trouble swallowing  Urinary: [ ]  chronic Kidney disease, [ ]  on HD - [ ]  MWF or [ ]  TTHS, [ ]  Burning with urination, [ ]  Frequent urination, [ ]  Difficulty urinating;   Skin: No rashes  Psychological: No history of anxiety,  No history of depression   Physical Examination  Vitals:   02/17/23 1248 02/17/23 1250   BP: 139/72 132/83  Pulse: 84   Resp: 16   Temp: 98.4 F (36.9 C)   TempSrc: Temporal   SpO2: 95%   Weight: 124 lb (56.2 kg)   Height: 5' (1.524 m)     Body mass index is 24.22 kg/m.  General:  Alert and oriented, no acute distress HEENT: Normal Neck: No bruit or JVD Pulmonary: Clear to auscultation bilaterally Cardiac: Regular Rate and Rhythm without murmur Gastrointestinal: Soft, non-tender, non-distended, no mass, no scars Skin: No rash Extremity Pulses:   radial pulses bilaterally Musculoskeletal: No deformity or edema  Neurologic: Upper and lower extremity motor 5/5 and symmetric  DATA:  Right Carotid Findings:  +----------+--------+--------+--------+------------------+--------+           PSV cm/sEDV cm/sStenosisPlaque DescriptionComments  +----------+--------+--------+--------+------------------+--------+  CCA Prox  90      17              heterogenous                +----------+--------+--------+--------+------------------+--------+  CCA Distal85      14              heterogenous                +----------+--------+--------+--------+------------------+--------+  ICA Prox  61      23      1-39%   heterogenous                +----------+--------+--------+--------+------------------+--------+  ICA Mid   118     33                                tortuous  +----------+--------+--------+--------+------------------+--------+  ICA Distal72      25                                          +----------+--------+--------+--------+------------------+--------+  ECA      113     21                                          +----------+--------+--------+--------+------------------+--------+   +----------+--------+-------+--------+-------------------+           PSV cm/sEDV cmsDescribeArm Pressure (mmHG)  +----------+--------+-------+--------+-------------------+  [id number]  125                   +----------+--------+-------+--------+-------------------+   +---------+--------+--+--------+--+---------+  VertebralPSV cm/s53EDV cm/s15Antegrade  +---------+--------+--+--------+--+---------+      Left Carotid Findings:  +----------+--------+--------+--------+------------------+--------+           PSV cm/sEDV cm/sStenosisPlaque DescriptionComments  +----------+--------+--------+--------+------------------+--------+  CCA Prox  66      15              heterogenous                +----------+--------+--------+--------+------------------+--------+  CCA Distal59      14              heterogenous                +----------+--------+--------+--------+------------------+--------+  ICA Prox  45      19      1-39%   heterogenous                +----------+--------+--------+--------+------------------+--------+  ICA Mid   82      31                                          +----------+--------+--------+--------+------------------+--------+  ICA Distal75      33                                          +----------+--------+--------+--------+------------------+--------+  ECA      80      13                                          +----------+--------+--------+--------+------------------+--------+   +----------+--------+--------+--------+-------------------+           PSV cm/sEDV cm/sDescribeArm Pressure (mmHG)  +----------+--------+--------+--------+-------------------+  [id number]                     130                  +----------+--------+--------+--------+-------------------+   +---------+--------+--+--------+--+---------+  VertebralPSV cm/s65EDV cm/s15Antegrade  +---------+--------+--+--------+--+---------+      Summary:  Right Carotid: Velocities in the right ICA are consistent with a 1-39%  stenosis.   Left Carotid: Velocities in the left ICA are consistent with a 1-39%  stenosis.     ASSESSMENT/PLAN: Asymptomatic carotid stenosis Her duplex shows < 39% B ICA's.  These studies show decreased stenosis.    She has remained asymptomatic and is medically managed on ASA/Statin daily.  I will have her f/u in 18 months for repeat surveillance duplex instead of yearly.  If she develops symptoms of stroke/TIA she will call 911.        Mosetta Pigeon PA-C [facility] Office: [phone]  MD in clinic [place]

## 2023-02-18 ENCOUNTER — Ambulatory Visit (INDEPENDENT_AMBULATORY_CARE_PROVIDER_SITE_OTHER): Payer: Medicare Other

## 2023-02-18 DIAGNOSIS — M8588 Other specified disorders of bone density and structure, other site: Secondary | ICD-10-CM | POA: Diagnosis not present

## 2023-02-18 DIAGNOSIS — M8580 Other specified disorders of bone density and structure, unspecified site: Secondary | ICD-10-CM

## 2023-02-18 MED ORDER — DENOSUMAB 60 MG/ML ~~LOC~~ SOSY
60.0000 mg | PREFILLED_SYRINGE | Freq: Once | SUBCUTANEOUS | Status: AC
  Administered 2023-02-18: 60 mg via SUBCUTANEOUS

## 2023-02-18 NOTE — Progress Notes (Signed)
Pt rec' prolia injection on L-upper arm sub-q per pt request. Pt tol injection well w/no c/o. Pt left ambulatory with no c/o any.

## 2023-02-20 DIAGNOSIS — Z23 Encounter for immunization: Secondary | ICD-10-CM | POA: Diagnosis not present

## 2023-02-26 ENCOUNTER — Other Ambulatory Visit: Payer: Self-pay | Admitting: Family Medicine

## 2023-02-27 NOTE — Telephone Encounter (Signed)
Requested medication (s) are due for refill today:   Requested medication (s) are on the active medication list: Yes  Last refill:  12/26/22  Future visit scheduled: Yes  Notes to clinic:  Not delegated.    Requested Prescriptions  Pending Prescriptions Disp Refills   ALPRAZolam (XANAX) 0.25 MG tablet [Pharmacy Med Name: ALPRAZOLAM 0.25MG  TABLETS] 90 tablet     Sig: TAKE 1 TABLET(0.25 MG) BY MOUTH THREE TIMES DAILY AS NEEDED FOR ANXIETY     Not Delegated - Psychiatry: Anxiolytics/Hypnotics 2 Failed - 02/26/2023  6:20 AM      Failed - This refill cannot be delegated      Failed - Urine Drug Screen completed in last 360 days      Failed - Valid encounter within last 6 months    Recent Outpatient Visits           2 years ago Osteopenia of lower leg, unspecified laterality   [facility] Donita Brooks, MD   3 years ago Routine general medical examination at a health care facility   [facility] Pickard, Priscille Heidelberg, MD   4 years ago Routine general medical examination at a health care facility   [facility] Pickard, Priscille Heidelberg, MD   5 years ago Screening cholesterol level   [facility] Tanya Nones, Priscille Heidelberg, MD   6 years ago Screening cholesterol level   [facility] Pickard, Priscille Heidelberg, MD       Future Appointments             In 9 months Pickard, Priscille Heidelberg, MD [place] [facility], [facility]            Passed - Patient is not pregnant

## 2023-03-03 NOTE — Telephone Encounter (Signed)
Patient called to follow up on refill requested for ALPRAZolam Prudy Feeler) 0.25 MG tablet [[id number]]  *Taking last pill this morning**  LOV 11/26/22 (CPE)  Pharmacy confirmed as   [id number] [place], [place] - [address] AT [facility] OF [address], [place] [address] Phone: [phone]  Fax: [phone] DEA #: [id number]    Please advise at [phone].

## 2023-03-04 ENCOUNTER — Other Ambulatory Visit: Payer: Self-pay | Admitting: Family Medicine

## 2023-03-06 ENCOUNTER — Other Ambulatory Visit: Payer: Self-pay | Admitting: Family Medicine

## 2023-03-06 NOTE — Telephone Encounter (Signed)
Requested Prescriptions  Pending Prescriptions Disp Refills   losartan (COZAAR) 50 MG tablet [Pharmacy Med Name: LOSARTAN 50MG  TABLETS] 90 tablet 0    Sig: TAKE 1 TABLET(50 MG) BY MOUTH DAILY     Cardiovascular:  Angiotensin Receptor Blockers Failed - 03/06/2023  8:19 AM      Failed - Valid encounter within last 6 months    Recent Outpatient Visits           2 years ago Osteopenia of lower leg, unspecified laterality   [facility] Pickard, Priscille Heidelberg, MD   3 years ago Routine general medical examination at a health care facility   [facility] Medicine Pickard, Priscille Heidelberg, MD   4 years ago Routine general medical examination at a health care facility   [facility] Medicine Pickard, Priscille Heidelberg, MD   5 years ago Screening cholesterol level   [facility] Tanya Nones, Priscille Heidelberg, MD   6 years ago Screening cholesterol level   [facility] Donita Brooks, MD       Future Appointments             In 8 months Pickard, Priscille Heidelberg, MD [place], [facility]            Passed - Cr in normal range and within 180 days    Creat  Date Value Ref Range Status  11/26/2022 0.70 0.60 - 1.00 mg/dL Final         Passed - K in normal range and within 180 days    Potassium  Date Value Ref Range Status  11/26/2022 4.3 3.5 - 5.3 mmol/L Final         Passed - Patient is not pregnant      Passed - Last BP in normal range    BP Readings from Last 1 Encounters:  02/17/23 132/83          atorvastatin (LIPITOR) 20 MG tablet [Pharmacy Med Name: ATORVASTATIN 20MG  TABLETS] 90 tablet 0    Sig: TAKE 1 TABLET(20 MG) BY MOUTH DAILY     Cardiovascular:  Antilipid - Statins Failed - 03/06/2023  8:19 AM      Failed - Valid encounter within last 12 months    Recent Outpatient Visits           2 years ago Osteopenia of lower leg, unspecified laterality   [facility] Donita Brooks, MD   3 years ago  Routine general medical examination at a health care facility   [facility] Medicine Pickard, Priscille Heidelberg, MD   4 years ago Routine general medical examination at a health care facility   [facility] Medicine Pickard, Priscille Heidelberg, MD   5 years ago Screening cholesterol level   [facility] Medicine Tanya Nones, Priscille Heidelberg, MD   6 years ago Screening cholesterol level   [facility] Medicine Pickard, Priscille Heidelberg, MD       Future Appointments             In 8 months Pickard, Priscille Heidelberg, MD [facility], [facility]            Failed - Lipid Panel in normal range within the last 12 months    Cholesterol  Date Value Ref Range Status  11/26/2022 193 <200 mg/dL Final   LDL Cholesterol (Calc)  Date Value Ref Range Status  11/26/2022 81 mg/dL (calc) Final  Comment:    Reference range: <100 . Desirable range <100 mg/dL for primary prevention;   <70 mg/dL for patients with CHD or diabetic patients  with > or = 2 CHD risk factors. Marland Kitchen LDL-C is now calculated using the Martin-Hopkins  calculation, which is a validated novel method providing  better accuracy than the Friedewald equation in the  estimation of LDL-C.  Horald Pollen et al. Lenox Ahr. 2956;213(08): 2061-2068  (http://education.[employer].com/faq/FAQ164)    HDL  Date Value Ref Range Status  11/26/2022 91 > OR = 50 mg/dL Final   Triglycerides  Date Value Ref Range Status  11/26/2022 115 <150 mg/dL Final         Passed - Patient is not pregnant       meloxicam (MOBIC) 15 MG tablet [Pharmacy Med Name: MELOXICAM 15MG  TABLETS] 90 tablet 0    Sig: TAKE 1 TABLET(15 MG) BY MOUTH DAILY     Analgesics:  COX2 Inhibitors Failed - 03/06/2023  8:19 AM      Failed - Manual Review: Labs are only required if the patient has taken medication for more than 8 weeks.      Failed - Valid encounter within last 12 months    Recent Outpatient Visits           2 years ago Osteopenia of lower leg,  unspecified laterality   [facility] Tanya Nones, Priscille Heidelberg, MD   3 years ago Routine general medical examination at a health care facility   [facility] Medicine Pickard, Priscille Heidelberg, MD   4 years ago Routine general medical examination at a health care facility   [facility] Medicine Pickard, Priscille Heidelberg, MD   5 years ago Screening cholesterol level   [facility] Medicine Tanya Nones, Priscille Heidelberg, MD   6 years ago Screening cholesterol level   [facility] Pickard, Priscille Heidelberg, MD       Future Appointments             In 8 months Pickard, Priscille Heidelberg, MD  [facility], [facility]            Passed - HGB in normal range and within 360 days    Hemoglobin  Date Value Ref Range Status  11/26/2022 13.3 11.7 - 15.5 g/dL Final         Passed - Cr in normal range and within 360 days    Creat  Date Value Ref Range Status  11/26/2022 0.70 0.60 - 1.00 mg/dL Final         Passed - HCT in normal range and within 360 days    HCT  Date Value Ref Range Status  11/26/2022 40.4 35.0 - 45.0 % Final         Passed - AST in normal range and within 360 days    AST  Date Value Ref Range Status  11/26/2022 13 10 - 35 U/L Final         Passed - ALT in normal range and within 360 days    ALT  Date Value Ref Range Status  11/26/2022 12 6 - 29 U/L Final         Passed - eGFR is 30 or above and within 360 days    GFR, Est African American  Date Value Ref Range Status  11/10/2020 99 > OR = 60 mL/min/1.74m2 Final   GFR, Est Non African American  Date Value Ref Range Status  11/10/2020 86 >  OR = 60 mL/min/1.59m2 Final   eGFR  Date Value Ref Range Status  11/26/2022 89 > OR = 60 mL/min/1.54m2 Final         Passed - Patient is not pregnant

## 2023-03-10 DIAGNOSIS — H2513 Age-related nuclear cataract, bilateral: Secondary | ICD-10-CM | POA: Diagnosis not present

## 2023-03-10 DIAGNOSIS — H25012 Cortical age-related cataract, left eye: Secondary | ICD-10-CM | POA: Diagnosis not present

## 2023-03-10 DIAGNOSIS — H524 Presbyopia: Secondary | ICD-10-CM | POA: Diagnosis not present

## 2023-03-10 DIAGNOSIS — H52203 Unspecified astigmatism, bilateral: Secondary | ICD-10-CM | POA: Diagnosis not present

## 2023-03-10 DIAGNOSIS — H04123 Dry eye syndrome of bilateral lacrimal glands: Secondary | ICD-10-CM | POA: Diagnosis not present

## 2023-04-25 DIAGNOSIS — K5909 Other constipation: Secondary | ICD-10-CM | POA: Diagnosis not present

## 2023-04-25 DIAGNOSIS — K219 Gastro-esophageal reflux disease without esophagitis: Secondary | ICD-10-CM | POA: Diagnosis not present

## 2023-05-14 DIAGNOSIS — Z1231 Encounter for screening mammogram for malignant neoplasm of breast: Secondary | ICD-10-CM | POA: Diagnosis not present

## 2023-05-14 LAB — HM MAMMOGRAPHY

## 2023-05-15 ENCOUNTER — Encounter: Payer: Self-pay | Admitting: Family Medicine

## 2023-06-02 ENCOUNTER — Other Ambulatory Visit: Payer: Self-pay | Admitting: Family Medicine

## 2023-06-04 ENCOUNTER — Other Ambulatory Visit: Payer: Self-pay | Admitting: Family Medicine

## 2023-06-05 NOTE — Telephone Encounter (Signed)
Requested medication (s) are due for refill today: yes   Requested medication (s) are on the active medication list: yes   Last refill:  03/06/23 #90 0 refills  Future visit scheduled: yes in 5 months.   Notes to clinic:   last OV 11/26/22 protocol failed. Last labs 11/26/22. Do you want to refill Rxs?     Requested Prescriptions  Pending Prescriptions Disp Refills   losartan (COZAAR) 50 MG tablet [Pharmacy Med Name: LOSARTAN 50MG  TABLETS] 90 tablet 0    Sig: TAKE 1 TABLET(50 MG) BY MOUTH DAILY     Cardiovascular:  Angiotensin Receptor Blockers Failed - 06/05/2023  4:00 PM      Failed - Cr in normal range and within 180 days    Creat  Date Value Ref Range Status  11/26/2022 0.70 0.60 - 1.00 mg/dL Final         Failed - K in normal range and within 180 days    Potassium  Date Value Ref Range Status  11/26/2022 4.3 3.5 - 5.3 mmol/L Final         Failed - Valid encounter within last 6 months    Recent Outpatient Visits           2 years ago Osteopenia of lower leg, unspecified laterality   [facility] Donita Brooks, MD   3 years ago Routine general medical examination at a health care facility   [facility] Medicine Pickard, Priscille Heidelberg, MD   4 years ago Routine general medical examination at a health care facility   [facility] Pickard, Priscille Heidelberg, MD   5 years ago Screening cholesterol level   [facility] Medicine Tanya Nones, Priscille Heidelberg, MD   7 years ago Screening cholesterol level   [facility] Pickard, Priscille Heidelberg, MD       Future Appointments             In 5 months Pickard, Priscille Heidelberg, MD [facility], [facility]            Passed - Patient is not pregnant      Passed - Last BP in normal range    BP Readings from Last 1 Encounters:  02/17/23 132/83          atorvastatin (LIPITOR) 20 MG tablet [Pharmacy Med Name: ATORVASTATIN 20MG  TABLETS] 90 tablet 0    Sig: TAKE 1 TABLET(20  MG) BY MOUTH DAILY     Cardiovascular:  Antilipid - Statins Failed - 06/05/2023  4:00 PM      Failed - Valid encounter within last 12 months    Recent Outpatient Visits           2 years ago Osteopenia of lower leg, unspecified laterality   [facility] Donita Brooks, MD   3 years ago Routine general medical examination at a health care facility   [facility] Medicine Pickard, Priscille Heidelberg, MD   4 years ago Routine general medical examination at a health care facility   [facility] Medicine Pickard, Priscille Heidelberg, MD   5 years ago Screening cholesterol level   [facility] Tanya Nones, Priscille Heidelberg, MD   7 years ago Screening cholesterol level   [facility] Pickard, Priscille Heidelberg, MD       Future Appointments             In 5 months Pickard, Priscille Heidelberg, MD  [facility], [facility]            Failed - Lipid Panel in normal range within the last 12 months    Cholesterol  Date Value Ref Range Status  11/26/2022 193 <200 mg/dL Final   LDL Cholesterol (Calc)  Date Value Ref Range Status  11/26/2022 81 mg/dL (calc) Final    Comment:    Reference range: <100 . Desirable range <100 mg/dL for primary prevention;   <70 mg/dL for patients with CHD or diabetic patients  with > or = 2 CHD risk factors. Marland Kitchen LDL-C is now calculated using the Martin-Hopkins  calculation, which is a validated novel method providing  better accuracy than the Friedewald equation in the  estimation of LDL-C.  Horald Pollen et al. Lenox Ahr. 4098;119(14): 2061-2068  (http://education.[facility].com/faq/FAQ164)    HDL  Date Value Ref Range Status  11/26/2022 91 > OR = 50 mg/dL Final   Triglycerides  Date Value Ref Range Status  11/26/2022 115 <150 mg/dL Final         Passed - Patient is not pregnant       meloxicam (MOBIC) 15 MG tablet [Pharmacy Med Name: MELOXICAM 15MG  TABLETS] 90 tablet 0    Sig: TAKE 1 TABLET(15 MG) BY MOUTH  DAILY     Analgesics:  COX2 Inhibitors Failed - 06/05/2023  4:00 PM      Failed - Manual Review: Labs are only required if the patient has taken medication for more than 8 weeks.      Failed - Valid encounter within last 12 months    Recent Outpatient Visits           2 years ago Osteopenia of lower leg, unspecified laterality   [facility] Pickard, Priscille Heidelberg, MD   3 years ago Routine general medical examination at a health care facility   [facility] Medicine Pickard, Priscille Heidelberg, MD   4 years ago Routine general medical examination at a health care facility   [facility] Pickard, Priscille Heidelberg, MD   5 years ago Screening cholesterol level   [facility] Tanya Nones, Priscille Heidelberg, MD   7 years ago Screening cholesterol level   [facility] Pickard, Priscille Heidelberg, MD       Future Appointments             In 5 months Pickard, Priscille Heidelberg, MD [place] [facility], [facility]            Passed - HGB in normal range and within 360 days    Hemoglobin  Date Value Ref Range Status  11/26/2022 13.3 11.7 - 15.5 g/dL Final         Passed - Cr in normal range and within 360 days    Creat  Date Value Ref Range Status  11/26/2022 0.70 0.60 - 1.00 mg/dL Final         Passed - HCT in normal range and within 360 days    HCT  Date Value Ref Range Status  11/26/2022 40.4 35.0 - 45.0 % Final         Passed - AST in normal range and within 360 days    AST  Date Value Ref Range Status  11/26/2022 13 10 - 35 U/L Final         Passed - ALT in normal range and within 360 days    ALT  Date Value Ref Range Status  11/26/2022 12 6 - 29 U/L Final         Passed - eGFR is 30 or above and within 360 days    GFR, Est African American  Date Value Ref Range Status  11/10/2020 99 > OR = 60 mL/min/1.78m2 Final   GFR, Est Non African American  Date Value Ref Range Status  11/10/2020 86 > OR = 60 mL/min/1.35m2 Final    eGFR  Date Value Ref Range Status  11/26/2022 89 > OR = 60 mL/min/1.26m2 Final         Passed - Patient is not pregnant

## 2023-06-13 ENCOUNTER — Other Ambulatory Visit: Payer: Self-pay | Admitting: Family Medicine

## 2023-06-13 MED ORDER — ATORVASTATIN CALCIUM 20 MG PO TABS: 20.0000 mg | ORAL_TABLET | Freq: Every day | ORAL | 1 refills | Status: DC

## 2023-06-13 MED ORDER — MELOXICAM 15 MG PO TABS: ORAL_TABLET | ORAL | 0 refills | Status: DC

## 2023-06-13 MED ORDER — LOSARTAN POTASSIUM 50 MG PO TABS: 50.0000 mg | ORAL_TABLET | Freq: Every day | ORAL | 1 refills | Status: DC

## 2023-06-13 NOTE — Telephone Encounter (Signed)
 Copied from CRM [id number]. Topic: Clinical - Medication Refill >> Jun 13, 2023  9:46 AM Elle L wrote: Most Recent Primary Care Visit:  Provider: DEBBY SIMPER  Department[facility]  Visit Type: NURSE VISIT  Date: 02/18/2023  Medication: atorvastatin  (LIPITOR) 20 MG tablet AND  losartan  (COZAAR ) 50 MG tablet AND meloxicam  (MOBIC ) 15 MG tablet   Has the patient contacted their pharmacy? Yes  Is this the correct pharmacy for this prescription? Yes  This is the patient's preferred pharmacy:  [id number] - [place], [place] - [address] AT [facility] OF [address] [place] [address] Phone: [phone] Fax: [phone]  Has the prescription been filled recently? Yes  Is the patient out of the medication? Yes  Has the patient been seen for an appointment in the last year OR does the patient have an upcoming appointment? Yes  Can we respond through MyChart? No  Agent: Please be advised that Rx refills may take up to 3 business days. We ask that you follow-up with your pharmacy.

## 2023-08-11 ENCOUNTER — Telehealth: Payer: Self-pay

## 2023-08-11 NOTE — Telephone Encounter (Signed)
 Copied from CRM [id number]. Topic: Appointments - Scheduling Inquiry for Clinic >> Aug 11, 2023  9:12 AM Franchot Heidelberg wrote: Reason for CRM: Pt called to remind the clinic that she is coming for her Prolia injection next Monday. Says that she was instructed to always call a week in advance to remind the clinic to order her injection.

## 2023-08-18 ENCOUNTER — Ambulatory Visit (INDEPENDENT_AMBULATORY_CARE_PROVIDER_SITE_OTHER): Payer: Medicare Other

## 2023-08-18 DIAGNOSIS — M810 Age-related osteoporosis without current pathological fracture: Secondary | ICD-10-CM

## 2023-08-18 MED ORDER — DENOSUMAB 60 MG/ML ~~LOC~~ SOSY
60.0000 mg | PREFILLED_SYRINGE | Freq: Once | SUBCUTANEOUS | Status: AC
Start: 2023-09-01 — End: 2023-08-18
  Administered 2023-08-18: 60 mg via SUBCUTANEOUS

## 2023-08-18 NOTE — Progress Notes (Signed)
 Patient is in office today for a nurse visit for  Prolia injection . Patient Injection was given in the  Left arm. Patient tolerated injection well. Insurance verified Prolia injection through [employer]. Pt aware of co-pay. Mjp,lpn

## 2023-09-05 ENCOUNTER — Other Ambulatory Visit: Payer: Self-pay | Admitting: Family Medicine

## 2023-09-08 NOTE — Telephone Encounter (Signed)
 Requested medication (s) are due for refill today: Yes  Requested medication (s) are on the active medication list: Yes  Last refill:    Future visit scheduled: No  Notes to clinic:  Manual review, not delegated.    Requested Prescriptions  Pending Prescriptions Disp Refills   meloxicam (MOBIC) 15 MG tablet [Pharmacy Med Name: MELOXICAM 15MG  TABLETS] 90 tablet 0    Sig: TAKE 1 TABLET(15 MG) BY MOUTH DAILY     Analgesics:  COX2 Inhibitors Failed - 09/08/2023  9:40 AM      Failed - Manual Review: Labs are only required if the patient has taken medication for more than 8 weeks.      Passed - HGB in normal range and within 360 days    Hemoglobin  Date Value Ref Range Status  11/26/2022 13.3 11.7 - 15.5 g/dL Final         Passed - Cr in normal range and within 360 days    Creat  Date Value Ref Range Status  11/26/2022 0.70 0.60 - 1.00 mg/dL Final         Passed - HCT in normal range and within 360 days    HCT  Date Value Ref Range Status  11/26/2022 40.4 35.0 - 45.0 % Final         Passed - AST in normal range and within 360 days    AST  Date Value Ref Range Status  11/26/2022 13 10 - 35 U/L Final         Passed - ALT in normal range and within 360 days    ALT  Date Value Ref Range Status  11/26/2022 12 6 - 29 U/L Final         Passed - eGFR is 30 or above and within 360 days    GFR, Est African American  Date Value Ref Range Status  11/10/2020 99 > OR = 60 mL/min/1.9m2 Final   GFR, Est Non African American  Date Value Ref Range Status  11/10/2020 86 > OR = 60 mL/min/1.53m2 Final   eGFR  Date Value Ref Range Status  11/26/2022 89 > OR = 60 mL/min/1.48m2 Final         Passed - Patient is not pregnant      Passed - Valid encounter within last 12 months    Recent Outpatient Visits           9 months ago Left sided sciatica   [facility] Pickard, Priscille Heidelberg, MD   1 year ago Carotid stenosis, asymptomatic, right   [place]  [facility] Pickard, Priscille Heidelberg, MD       Future Appointments             In 2 months Pickard, Priscille Heidelberg, MD  [facility]             ALPRAZolam Prudy Feeler) 0.25 MG tablet [Pharmacy Med Name: ALPRAZOLAM 0.25MG  TABLETS] 270 tablet     Sig: TAKE 1 TABLET(0.25 MG) BY MOUTH THREE TIMES DAILY AS NEEDED FOR ANXIETY     Not Delegated - Psychiatry: Anxiolytics/Hypnotics 2 Failed - 09/08/2023  9:40 AM      Failed - This refill cannot be delegated      Failed - Urine Drug Screen completed in last 360 days      Failed - Valid encounter within last 6 months    Recent Outpatient Visits  9 months ago Left sided sciatica   [facility] Pickard, Priscille Heidelberg, MD   1 year ago Carotid stenosis, asymptomatic, right   [place] [facility] Pickard, Priscille Heidelberg, MD       Future Appointments             In 2 months Pickard, Priscille Heidelberg, MD [facility] [place]            Passed - Patient is not pregnant

## 2023-11-28 ENCOUNTER — Ambulatory Visit: Payer: Medicare Other | Admitting: Family Medicine

## 2023-11-28 ENCOUNTER — Encounter: Payer: Self-pay | Admitting: Family Medicine

## 2023-11-28 VITALS — BP 120/70 | HR 77 | Temp 97.6°F | Ht 60.0 in | Wt 117.2 lb

## 2023-11-28 DIAGNOSIS — I6521 Occlusion and stenosis of right carotid artery: Secondary | ICD-10-CM | POA: Diagnosis not present

## 2023-11-28 DIAGNOSIS — Z0000 Encounter for general adult medical examination without abnormal findings: Secondary | ICD-10-CM

## 2023-11-28 DIAGNOSIS — M810 Age-related osteoporosis without current pathological fracture: Secondary | ICD-10-CM

## 2023-11-28 DIAGNOSIS — E7800 Pure hypercholesterolemia, unspecified: Secondary | ICD-10-CM

## 2023-11-28 DIAGNOSIS — Z0001 Encounter for general adult medical examination with abnormal findings: Secondary | ICD-10-CM

## 2023-11-28 NOTE — Progress Notes (Signed)
 Subjective:    Patient ID: Melissa Owen, female    DOB: 1945/07/23, 78 y.o.   MRN: [id number]  HPI Subjective:   Patient is a very pleasant 78 year old Caucasian female here today for complete physical exam.  Past medical history significant for osteoporosis.  She is currently on Prolia .  Her last bone density test was less than 2 years ago.  She is due for a new bone density.  Last colonoscopy was in 2022.  They recommended a repeat colonoscopy after 5 years but that would make her 78 years old so therefore I do not feel that she needs a repeat colonoscopy if she is asymptomatic.  Due to her age she does not require Pap smear.  She has had the RSV vaccine, the shingles vaccine, her pneumonia vaccine.  I recommended a tetanus shot.  Also recommended annual flu shot and COVID shot in the fall.  Otherwise she is doing well with no concerns. Immunization History  Administered Date(s) Administered   Fluad Trivalent(High Dose 65+) 02/20/2023   Influenza Whole 03/10/2009   Influenza, High Dose Seasonal PF 02/25/2016, 03/12/2018, 01/30/2019   Influenza,inj,Quad PF,6+ Mos 02/16/2013, 03/14/2014, 05/02/2015   Influenza-Unspecified 03/10/2017   PFIZER(Purple Top)SARS-COV-2 Vaccination 06/30/2019, 07/21/2019, 03/03/2020, 09/19/2020   Pneumococcal Conjugate-13 03/14/2014   Pneumococcal Polysaccharide-23 03/30/2009, 10/06/2019   Tdap 12/19/2008   Zoster Recombinant(Shingrix) 10/08/2016, 12/08/2016   Zoster, Live 12/19/2008    Review Past Medical/Family/Social: Past Medical History:  Diagnosis Date   Anxiety    Carotid stenosis, asymptomatic, right    50-69% (2020)   Hyperlipidemia    Hypertension    IBS (irritable bowel syndrome)    Mitral valve prolapse    Osteopenia 06/2015   T score - 2.0   Urinary incontinence    Past Surgical History:  Procedure Laterality Date   ABDOMINAL HYSTERECTOMY  age 35   Leiomyomata   APPENDECTOMY  78 years of age   with Hysterectomy   carpal tunnel  repair     TUBAL LIGATION     Current Outpatient Medications on File Prior to Visit  Medication Sig Dispense Refill   ALPRAZolam  (XANAX ) 0.25 MG tablet TAKE 1 TABLET(0.25 MG) BY MOUTH THREE TIMES DAILY AS NEEDED FOR ANXIETY 270 tablet 0   aspirin EC 81 MG tablet Take 81 mg by mouth daily.     atorvastatin  (LIPITOR) 20 MG tablet Take 1 tablet (20 mg total) by mouth daily. 90 tablet 1   Cholecalciferol (VITAMIN D  PO) Take by mouth.     famotidine (PEPCID) 40 MG tablet Take 40 mg by mouth 2 (two) times daily.     losartan  (COZAAR ) 50 MG tablet Take 1 tablet (50 mg total) by mouth daily. 90 tablet 1   meloxicam  (MOBIC ) 15 MG tablet TAKE 1 TABLET(15 MG) BY MOUTH DAILY 90 tablet 0   pantoprazole (PROTONIX) 40 MG tablet Take 40 mg by mouth daily.     Probiotic Product (PROBIOTIC PO) Take by mouth.     Wheat Dextrin (BENEFIBER PO) Take by mouth.     Coenzyme Q10-Vitamin E (QUNOL ULTRA COQ10) 100-150 MG-UNIT CAPS Take 100-150 mg of amoxicillin by mouth daily.     LINZESS 145 MCG CAPS capsule Take 145 mcg by mouth every morning. (Patient not taking: Reported on 11/28/2023)     No current facility-administered medications on file prior to visit.   Allergies  Allergen Reactions   Aspirin Nausea And Vomiting   Fosamax  [Alendronate ]     GI upset  Latex Itching and Dermatitis   Social History   Socioeconomic History   Marital status: Divorced    Spouse name: Not on file   Number of children: Not on file   Years of education: Not on file   Highest education level: Not on file  Occupational History   Not on file  Tobacco Use   Smoking status: Never    Passive exposure: Never   Smokeless tobacco: Never  Vaping Use   Vaping status: Never Used  Substance and Sexual Activity   Alcohol use: Never    Alcohol/week: 0.0 standard drinks of alcohol   Drug use: No   Sexual activity: Not Currently    Comment: 1st intercourse 78 yo-Fewer than 5 partners  Other Topics Concern   Not on file   Social History Narrative   Not on file   Social Drivers of Health   Financial Resource Strain: Not on file  Food Insecurity: Not on file  Transportation Needs: Not on file  Physical Activity: Not on file  Stress: Not on file  Social Connections: Not on file  Intimate Partner Violence: Not on file   Family History  Problem Relation Age of Onset   Dementia Mother    Heart disease Mother 56   Heart disease Father    Cancer Maternal Grandmother        colon cancer    Review of Systems  All other systems reviewed and are negative.      Objective:   Physical Exam Vitals reviewed.  Constitutional:      General: She is not in acute distress.    Appearance: She is well-developed. She is not diaphoretic.  HENT:     Head: Normocephalic and atraumatic.     Right Ear: External ear normal.     Left Ear: External ear normal.     Nose: Nose normal.     Mouth/Throat:     Pharynx: No oropharyngeal exudate.   Eyes:     General: No scleral icterus.       Right eye: No discharge.        Left eye: No discharge.     Conjunctiva/sclera: Conjunctivae normal.     Pupils: Pupils are equal, round, and reactive to light.   Neck:     Thyroid : No thyromegaly.     Vascular: No JVD.     Trachea: No tracheal deviation.   Cardiovascular:     Rate and Rhythm: Normal rate and regular rhythm.     Heart sounds: Normal heart sounds. No murmur heard.    No friction rub. No gallop.  Pulmonary:     Effort: Pulmonary effort is normal. No respiratory distress.     Breath sounds: Normal breath sounds. No stridor. No wheezing or rales.  Chest:     Chest wall: No tenderness.  Abdominal:     General: Bowel sounds are normal. There is no distension.     Palpations: Abdomen is soft. There is no mass.     Tenderness: There is no abdominal tenderness. There is no guarding or rebound.   Musculoskeletal:        General: No tenderness. Normal range of motion.     Cervical back: Normal range of motion  and neck supple.  Lymphadenopathy:     Cervical: No cervical adenopathy.   Skin:    General: Skin is warm.     Coloration: Skin is not pale.     Findings: No erythema or rash.  Neurological:     Mental Status: She is alert and oriented to person, place, and time.     Cranial Nerves: No cranial nerve deficit.     Motor: No abnormal muscle tone.     Coordination: Coordination normal.     Deep Tendon Reflexes: Reflexes are normal and symmetric.   Psychiatric:        Behavior: Behavior normal.        Thought Content: Thought content normal.        Judgment: Judgment normal.         Assessment & Plan:  Osteoporosis, unspecified osteoporosis type, unspecified pathological fracture presence - Plan: DG Bone Density  Carotid stenosis, asymptomatic, right - Plan: CBC with Differential/Platelet, Comprehensive metabolic panel with GFR, Lipid panel  Pure hypercholesterolemia - Plan: CBC with Differential/Platelet, Comprehensive metabolic panel with GFR, Lipid panel  Routine general medical examination at a health care facility Patient's preventative care is up-to-date.  I recommended a flu shot and a COVID shot in the fall.  The remainder of her immunizations are up-to-date.  Check CBC CMP and lipid panel.  Goal LDL cholesterol is less than 70 given her history of carotid artery disease.  Mammogram is up-to-date having been done in December 2024.  I will schedule the patient for a bone density test.  Colonoscopy is up-to-date.  Regular anticipatory guidance is provided.  She denies any falls, depression, or memory loss

## 2023-11-29 LAB — CBC WITH DIFFERENTIAL/PLATELET
Absolute Lymphocytes: 2020 {cells}/uL (ref 850–3900)
Absolute Monocytes: 561 {cells}/uL (ref 200–950)
Basophils Absolute: 59 {cells}/uL (ref 0–200)
Basophils Relative: 0.9 %
Eosinophils Absolute: 172 {cells}/uL (ref 15–500)
Eosinophils Relative: 2.6 %
HCT: 42.2 % (ref 35.0–45.0)
Hemoglobin: 13.3 g/dL (ref 11.7–15.5)
MCH: 30.2 pg (ref 27.0–33.0)
MCHC: 31.5 g/dL — ABNORMAL LOW (ref 32.0–36.0)
MCV: 95.9 fL (ref 80.0–100.0)
MPV: 10.9 fL (ref 7.5–12.5)
Monocytes Relative: 8.5 %
Neutro Abs: 3788 {cells}/uL (ref 1500–7800)
Neutrophils Relative %: 57.4 %
Platelets: 266 10*3/uL (ref 140–400)
RBC: 4.4 10*6/uL (ref 3.80–5.10)
RDW: 12.2 % (ref 11.0–15.0)
Total Lymphocyte: 30.6 %
WBC: 6.6 10*3/uL (ref 3.8–10.8)

## 2023-11-29 LAB — COMPREHENSIVE METABOLIC PANEL WITH GFR
AG Ratio: 1.8 (calc) (ref 1.0–2.5)
ALT: 16 U/L (ref 6–29)
AST: 18 U/L (ref 10–35)
Albumin: 4.6 g/dL (ref 3.6–5.1)
Alkaline phosphatase (APISO): 57 U/L (ref 37–153)
BUN: 11 mg/dL (ref 7–25)
CO2: 28 mmol/L (ref 20–32)
Calcium: 9.4 mg/dL (ref 8.6–10.4)
Chloride: 103 mmol/L (ref 98–110)
Creat: 0.79 mg/dL (ref 0.60–1.00)
Globulin: 2.6 g/dL (ref 1.9–3.7)
Glucose, Bld: 95 mg/dL (ref 65–99)
Potassium: 4.8 mmol/L (ref 3.5–5.3)
Sodium: 141 mmol/L (ref 135–146)
Total Bilirubin: 0.6 mg/dL (ref 0.2–1.2)
Total Protein: 7.2 g/dL (ref 6.1–8.1)
eGFR: 77 mL/min/{1.73_m2} (ref >=60)

## 2023-11-29 LAB — LIPID PANEL
Cholesterol: 179 mg/dL (ref <200)
HDL: 89 mg/dL (ref >=50)
LDL Cholesterol (Calc): 71 mg/dL
Non-HDL Cholesterol (Calc): 90 mg/dL (ref <130)
Total CHOL/HDL Ratio: 2 (calc) (ref <5.0)
Triglycerides: 100 mg/dL (ref <150)

## 2023-12-01 ENCOUNTER — Ambulatory Visit: Payer: Self-pay | Admitting: Family Medicine

## 2023-12-04 ENCOUNTER — Other Ambulatory Visit: Payer: Self-pay | Admitting: Family Medicine

## 2023-12-09 ENCOUNTER — Other Ambulatory Visit: Payer: Self-pay | Admitting: Family Medicine

## 2024-01-19 ENCOUNTER — Telehealth: Payer: Self-pay

## 2024-01-19 NOTE — Telephone Encounter (Signed)
 Melissa Owen

## 2024-01-19 NOTE — Telephone Encounter (Signed)
 Prolia  VOB initiated via [email or web address]  Next Prolia  inj DUE: 02/18/24

## 2024-01-19 NOTE — Telephone Encounter (Signed)
 Pt ready for scheduling for PROLIA  on or after : 02/18/24  Option# 1: Buy/Bill (Office supplied medication)  Out-of-pocket cost due at time of clinic visit: $0  Number of injection/visits approved: ---  Primary: MEDICARE Prolia  co-insurance: 0% Admin fee co-insurance: 0%  Secondary: [employer] Prolia  co-insurance:  Admin fee co-insurance:   Medical Benefit Details: Date Benefits were checked: 01/19/24 Deductible: $257 Met of $257 Required/ Coinsurance: 0%/ Admin Fee: 0%  Prior Auth: N/A PA# Expiration Date:   # of doses approved: ----------------------------------------------------------------------- Option# 2- Med Obtained from pharmacy:  Pharmacy benefit: Copay $--- (Paid to pharmacy) Admin Fee: --- (Pay at clinic)  Prior Auth: --- PA# Expiration Date:   # of doses approved:   If patient wants fill through the pharmacy benefit please send prescription to: ---, and include estimated need by date in rx notes. Pharmacy will ship medication directly to the office.  Patient NOT eligible for Prolia  Copay Card. Copay Card can make patient's cost as little as $25. Link to apply: [email or web address]://[email or web address]  ** This summary of benefits is an estimation of the patient's out-of-pocket cost. Exact cost may very based on individual plan coverage.

## 2024-01-28 ENCOUNTER — Telehealth: Payer: Self-pay

## 2024-01-28 NOTE — Telephone Encounter (Signed)
 Pt ready for scheduling for PROLIA  on or after : 02/18/24  Option# 1: Buy/Bill (Office supplied medication)  Out-of-pocket cost due at time of clinic visit: $0  Number of injection/visits approved: ---  Primary: MEDICARE Prolia  co-insurance: 0% Admin fee co-insurance: 0%  Secondary: [employer] Prolia  co-insurance:  Admin fee co-insurance:   Medical Benefit Details: Date Benefits were checked: 01/19/24 Deductible: $257 Met of $257 Required/ Coinsurance: 0%/ Admin Fee: 0%  Prior Auth: N/A PA# Expiration Date:   # of doses approved: ----------------------------------------------------------------------- Option# 2- Med Obtained from pharmacy:  Pharmacy benefit: Copay $--- (Paid to pharmacy) Admin Fee: --- (Pay at clinic)  Prior Auth: --- PA# Expiration Date:   # of doses approved:   If patient wants fill through the pharmacy benefit please send prescription to: ---, and include estimated need by date in rx notes. Pharmacy will ship medication directly to the office.  Patient NOT eligible for Prolia  Copay Card. Copay Card can make patient's cost as little as $25. Link to apply: [email or web address]://[email or web address]  ** This summary of benefits is an estimation of the patient's out-of-pocket cost. Exact cost may very based on individual plan coverage.

## 2024-02-18 ENCOUNTER — Ambulatory Visit (INDEPENDENT_AMBULATORY_CARE_PROVIDER_SITE_OTHER)

## 2024-02-18 DIAGNOSIS — M810 Age-related osteoporosis without current pathological fracture: Secondary | ICD-10-CM

## 2024-02-18 MED ORDER — DENOSUMAB 60 MG/ML ~~LOC~~ SOSY
60.0000 mg | PREFILLED_SYRINGE | SUBCUTANEOUS | Status: AC
  Administered 2024-02-18: 60 mg via SUBCUTANEOUS

## 2024-02-18 NOTE — Progress Notes (Signed)
 Patient is in office today for a nurse visit for Prolia  injection. Patient Injection was given in the  Left arm. Patient tolerated injection well.Patient will return in 6 months for an additional dose of Prolia .   Elida GORMAN Manila, CMA

## 2024-03-04 ENCOUNTER — Other Ambulatory Visit: Payer: Self-pay | Admitting: Family Medicine

## 2024-03-10 ENCOUNTER — Other Ambulatory Visit: Payer: Self-pay | Admitting: Family Medicine

## 2024-03-11 DIAGNOSIS — H25012 Cortical age-related cataract, left eye: Secondary | ICD-10-CM | POA: Diagnosis not present

## 2024-03-11 DIAGNOSIS — H2513 Age-related nuclear cataract, bilateral: Secondary | ICD-10-CM | POA: Diagnosis not present

## 2024-03-11 DIAGNOSIS — H52203 Unspecified astigmatism, bilateral: Secondary | ICD-10-CM | POA: Diagnosis not present

## 2024-03-11 DIAGNOSIS — H04123 Dry eye syndrome of bilateral lacrimal glands: Secondary | ICD-10-CM | POA: Diagnosis not present

## 2024-03-11 DIAGNOSIS — H524 Presbyopia: Secondary | ICD-10-CM | POA: Diagnosis not present

## 2024-03-19 DIAGNOSIS — Z23 Encounter for immunization: Secondary | ICD-10-CM | POA: Diagnosis not present

## 2024-04-19 DIAGNOSIS — Z961 Presence of intraocular lens: Secondary | ICD-10-CM | POA: Diagnosis not present

## 2024-04-19 DIAGNOSIS — H25812 Combined forms of age-related cataract, left eye: Secondary | ICD-10-CM | POA: Diagnosis not present

## 2024-04-19 DIAGNOSIS — H25012 Cortical age-related cataract, left eye: Secondary | ICD-10-CM | POA: Diagnosis not present

## 2024-04-19 DIAGNOSIS — H2512 Age-related nuclear cataract, left eye: Secondary | ICD-10-CM | POA: Diagnosis not present

## 2024-05-03 DIAGNOSIS — K5900 Constipation, unspecified: Secondary | ICD-10-CM | POA: Diagnosis not present

## 2024-05-03 DIAGNOSIS — K219 Gastro-esophageal reflux disease without esophagitis: Secondary | ICD-10-CM | POA: Diagnosis not present

## 2024-05-17 DIAGNOSIS — H25811 Combined forms of age-related cataract, right eye: Secondary | ICD-10-CM | POA: Diagnosis not present

## 2024-05-17 DIAGNOSIS — Z961 Presence of intraocular lens: Secondary | ICD-10-CM | POA: Diagnosis not present

## 2024-05-17 DIAGNOSIS — H2511 Age-related nuclear cataract, right eye: Secondary | ICD-10-CM | POA: Diagnosis not present

## 2024-05-28 LAB — HM MAMMOGRAPHY

## 2024-06-01 ENCOUNTER — Encounter: Payer: Self-pay | Admitting: Family Medicine

## 2024-06-05 ENCOUNTER — Other Ambulatory Visit: Payer: Self-pay | Admitting: Family Medicine

## 2024-06-12 ENCOUNTER — Other Ambulatory Visit: Payer: Self-pay | Admitting: Family Medicine

## 2024-07-13 ENCOUNTER — Other Ambulatory Visit (HOSPITAL_BASED_OUTPATIENT_CLINIC_OR_DEPARTMENT_OTHER)

## 2024-08-17 ENCOUNTER — Ambulatory Visit

## 2024-09-06 ENCOUNTER — Encounter: Admitting: Radiology

## 2024-12-03 ENCOUNTER — Encounter: Admitting: Family Medicine

## 2025-02-24 ENCOUNTER — Other Ambulatory Visit (HOSPITAL_BASED_OUTPATIENT_CLINIC_OR_DEPARTMENT_OTHER)
